# Patient Record
Sex: Male | Born: 1985 | Race: Black or African American | Hispanic: No | Marital: Single | Smoking: Current some day smoker
Health system: Southern US, Community
[De-identification: ages and names within clinical notes are randomized; demographics above are authoritative.]

## PROBLEM LIST (undated history)

## (undated) DIAGNOSIS — A630 Anogenital (venereal) warts: Secondary | ICD-10-CM

## (undated) DIAGNOSIS — F209 Schizophrenia, unspecified: Secondary | ICD-10-CM

---

## 2013-01-14 ENCOUNTER — Encounter (HOSPITAL_COMMUNITY): Payer: Self-pay | Admitting: Emergency Medicine

## 2013-01-14 ENCOUNTER — Emergency Department (HOSPITAL_COMMUNITY)
Admission: EM | Admit: 2013-01-14 | Discharge: 2013-01-14 | Disposition: A | Discharge status: Home / Self Care | Payer: Self-pay | Attending: Emergency Medicine | Admitting: Emergency Medicine

## 2013-01-14 DIAGNOSIS — Z8619 Personal history of other infectious and parasitic diseases: Secondary | ICD-10-CM | POA: Insufficient documentation

## 2013-01-14 DIAGNOSIS — Z8659 Personal history of other mental and behavioral disorders: Secondary | ICD-10-CM | POA: Insufficient documentation

## 2013-01-14 DIAGNOSIS — IMO0002 Reserved for concepts with insufficient information to code with codable children: Secondary | ICD-10-CM | POA: Insufficient documentation

## 2013-01-14 DIAGNOSIS — F17200 Nicotine dependence, unspecified, uncomplicated: Secondary | ICD-10-CM | POA: Insufficient documentation

## 2013-01-14 HISTORY — DX: Anogenital (venereal) warts: A63.0

## 2013-01-14 HISTORY — DX: Schizophrenia, unspecified: F20.9

## 2013-01-14 MED ORDER — HYDROCORTISONE 1 % EX CREA: TOPICAL_CREAM | CUTANEOUS | Status: DC

## 2013-01-14 NOTE — ED Provider Notes (Signed)
CSN: [id number]     Arrival date & time 01/14/13  1722 History  This chart was scribed for non-physician practitioner Coral Ceo, PA-C, working with Paul Canal, MD by Dorothey Baseman, ED Scribe. This patient was seen in room TR10C/TR10C and the patient's care was started at 8:08 PM.    Chief Complaint  Patient presents with  . Genital Warts   The history is provided by the patient. No language interpreter was used.   HPI Comments: Paul Mendez is a 27 y.o. Male with a history of genital warts and schizophrenia who presents to the Emergency Department complaining of genital sores.  Patient states that the genital sores have been present for 2 weeks. He states that the lesions have been spreading however are healing. Patient states that the sores are present on the shaft of the penis and super pubic region. He denies any sores to his scrotum or anus. He denies any drainage or spreading spreading redness/swelling.  Patient states that he has had genital warts past however the sores today are not similar. He's not had anything to alleviate his symptoms. He is unsure of his previous partners STI history. He denies associated penile discharge, penile pain, testicular pain, dysuria, or hematuria. He denies any new, recent sexual partners or potential exposure. Patient reports that he has not been sexually active since June, 2014. He is unsure of the STI status of his previous sexual partner. Patient states he is otherwise been well with no fever, chills, change in appetite or activity, abdominal pain, nausea, vomiting, diarrhea, constipation, myalgias, headache, dizziness, lightheadedness, rhinorrhea, congestion, eye pain or sore throat.    Past Medical History  Diagnosis Date  . Genital warts   . Schizophrenia    History reviewed. No pertinent past surgical history. No family history on file. History  Substance Use Topics  . Smoking status: Current Some Day Smoker    Types: Cigarettes  . Smokeless  tobacco: Not on file  . Alcohol Use: Yes     Comment: occ    Review of Systems  Constitutional: Negative for fever, chills, diaphoresis, activity change, appetite change and fatigue.  HENT: Negative for mouth sores, rhinorrhea, sore throat, trouble swallowing and voice change.   Eyes: Negative for pain, discharge, itching and visual disturbance.  Respiratory: Negative for shortness of breath.   Cardiovascular: Negative for chest pain.  Gastrointestinal: Negative for nausea, vomiting, abdominal pain, diarrhea, constipation and abdominal distention.  Genitourinary: Positive for genital sores. Negative for dysuria, urgency, hematuria, flank pain, decreased urine volume, discharge, penile swelling, scrotal swelling, difficulty urinating, penile pain and testicular pain.  Musculoskeletal: Negative for back pain, gait problem and myalgias.  Skin: Positive for wound.  Neurological: Negative for dizziness, weakness, light-headedness, numbness and headaches.    Allergies  Review of patient's allergies indicates no known allergies.  Home Medications  No current outpatient prescriptions on file.  Triage Vitals: BP 124/61  Pulse 73  Temp(Src) 98.3 F (36.8 C) (Oral)  Resp 16  Ht 6\' 2"  (1.88 m)  Wt 183 lb 3.2 oz (83.099 kg)  BMI 23.51 kg/m2  SpO2 97%  Filed Vitals:   01/14/13 1748 01/14/13 2311  BP: 124/61 124/88  Pulse: 73 56  Temp: 98.3 F (36.8 C)   TempSrc: Oral   Resp: 16 16  Height: 6\' 2"  (1.88 m)   Weight: 183 lb 3.2 oz (83.099 kg)   SpO2: 97% 99%     Physical Exam  Nursing note and vitals reviewed. Constitutional: He  is oriented to person, place, and time. He appears well-developed and well-nourished. No distress.  HENT:  Head: Normocephalic and atraumatic.  Right Ear: External ear normal.  Left Ear: External ear normal.  Mouth/Throat: Oropharynx is clear and moist. No oropharyngeal exudate.  Eyes: Conjunctivae are normal. Right eye exhibits no discharge. Left eye  exhibits no discharge.  Neck: Normal range of motion. Neck supple.  Cardiovascular: Normal rate, regular rhythm, normal heart sounds and intact distal pulses.  Exam reveals no gallop and no friction rub.   No murmur heard. Dorsalis pedis pulses present and equal bilaterally  Pulmonary/Chest: Effort normal and breath sounds normal. No respiratory distress. He has no wheezes. He has no rales. He exhibits no tenderness.  Abdominal: Soft. He exhibits no distension. There is no tenderness.  Genitourinary: No penile tenderness.  4-5 circular, 3 mm, papular, flesh-colored, closed, diffuse lesions present on the under side of the shaft of the penis without surrounding erythema or edema. 3-4 closed 5 mm scabs present in the suprapubic region with no open lesions or sores. No testicular tenderness or swelling. No penile tenderness or discharge. No erythema or sores to the urethral meatus. 2-3  1 cm, circular, mobile, firm inguinal lymph nodes palpated bilaterally with no overlying edema, erythema, or open lesions. No lesions to the external anus. No external fissures or hemorrhoids.  Musculoskeletal: Normal range of motion. He exhibits no edema and no tenderness.  Patient able to ambulate without difficulty or ataxia  Neurological: He is alert and oriented to person, place, and time.  Skin: Skin is warm and dry.  Psychiatric: He has a normal mood and affect. His behavior is normal.    ED Course  Procedures (including critical care time)  DIAGNOSTIC STUDIES: Oxygen Saturation is 97% on room air, normal by my interpretation.    COORDINATION OF CARE: 8:11PM- Discussed that sores do not appear like typical genital warts or herpes. Will order STD testing. Will consult with the attending physician. Discussed treatment plan with patient at bedside and patient verbalized agreement.   Labs Review Labs Reviewed - No data to display Imaging Review No results found.  EKG Interpretation   None       MDM    1. Genital problem     [name] is a 27 y.o. Male with a history of genital warts and schizophrenia who presents to the Emergency Department complaining of genital sores.  HIV, gonorrhea, Chlamydia, and syphilis testing ordered.     Etiology of genital sores is unclear.  Molluscum contagiosum is a possible etiology. Patient denies any recent sexual encounters or exposures. Patient had no evidence of vesicular lesions to suggest a herpes infection. His lesions also did not appear to be genital warts or a chancre. Patient's lesions appear relatively benign. Patient was given a prescription for hydrocortisone cream. Patient's STI testing is pending. He was instructed to refrain from intercourse until testing has returned. He was also instructed to practice safe sex. He was instructed to keep the area clean and dry and practice good hygiene. Patient was instructed to return to the ED if they experience any dysuria, hematuria, abdominal pain, repeated vomiting, testicular pain, fever, penile/testicular swelling or redness, or other concerns.  Patient instructed to follow up with a primary care provider as soon as possible for further evaluation and management. Patient was provided with the resource guide. Patient was in agreement with discharge and plan.     Final impressions: 1. genital problem    Greer Ee  Rubye Oaks PA-C   This patient was discussed with Dr. Silverio Lay who evaluated the patient  I personally performed the services described in this documentation, which was scribed in my presence. The recorded information has been reviewed and is accurate.     Jillyn Ledger, PA-C 01/15/13 0348  Jillyn Ledger, PA-C 01/15/13 [phone]

## 2013-01-14 NOTE — ED Notes (Signed)
Pt with hx of genital warts - states he having a "flare-up".

## 2013-01-15 LAB — GC/CHLAMYDIA PROBE AMP
CT Probe RNA: NEGATIVE
GC Probe RNA: NEGATIVE

## 2013-01-15 LAB — HIV ANTIBODY (ROUTINE TESTING W REFLEX): HIV: NONREACTIVE

## 2013-01-15 LAB — RPR: RPR Ser Ql: NONREACTIVE

## 2013-01-15 NOTE — ED Provider Notes (Signed)
Medical screening examination/treatment/procedure(s) were conducted as a shared visit with non-physician practitioner(s) and myself.  I personally evaluated the patient during the encounter  Paul Mendez is a 27 y.o. male here with lesions of penis. He hasn't been sexually active for several months. Noticed lesions on penis for the last 2 weeks. Lesions are not painful and he has no urethral discharge. Small macular lesions on penis but LAD. I doubt herpes. Can be allergic vs syphilis vs mild mulluscum. GC/chlamydia/syphilis ordered. Recommend hydrocortisone cream prn and safe sex.    Richardean Canal, MD 01/15/13 1332

## 2013-09-01 DIAGNOSIS — N529 Male erectile dysfunction, unspecified: Secondary | ICD-10-CM | POA: Insufficient documentation

## 2013-09-01 DIAGNOSIS — R590 Localized enlarged lymph nodes: Secondary | ICD-10-CM | POA: Insufficient documentation

## 2015-04-21 DIAGNOSIS — F17200 Nicotine dependence, unspecified, uncomplicated: Secondary | ICD-10-CM | POA: Insufficient documentation

## 2016-09-22 ENCOUNTER — Emergency Department (HOSPITAL_COMMUNITY): Payer: Self-pay

## 2016-09-22 ENCOUNTER — Encounter (HOSPITAL_COMMUNITY): Payer: Self-pay | Admitting: Emergency Medicine

## 2016-09-22 ENCOUNTER — Emergency Department (HOSPITAL_COMMUNITY)
Admission: EM | Admit: 2016-09-22 | Discharge: 2016-09-28 | Disposition: A | Discharge status: Other Acute Inpatient Hospital | Payer: Self-pay | Attending: Emergency Medicine | Admitting: Emergency Medicine

## 2016-09-22 DIAGNOSIS — S0083XA Contusion of other part of head, initial encounter: Secondary | ICD-10-CM | POA: Insufficient documentation

## 2016-09-22 DIAGNOSIS — S0993XA Unspecified injury of face, initial encounter: Secondary | ICD-10-CM

## 2016-09-22 DIAGNOSIS — F17210 Nicotine dependence, cigarettes, uncomplicated: Secondary | ICD-10-CM | POA: Insufficient documentation

## 2016-09-22 DIAGNOSIS — K0882 Secondary occlusal trauma: Secondary | ICD-10-CM | POA: Insufficient documentation

## 2016-09-22 DIAGNOSIS — Y999 Unspecified external cause status: Secondary | ICD-10-CM | POA: Insufficient documentation

## 2016-09-22 DIAGNOSIS — Z79899 Other long term (current) drug therapy: Secondary | ICD-10-CM | POA: Insufficient documentation

## 2016-09-22 DIAGNOSIS — F250 Schizoaffective disorder, bipolar type: Secondary | ICD-10-CM | POA: Diagnosis present

## 2016-09-22 DIAGNOSIS — F209 Schizophrenia, unspecified: Secondary | ICD-10-CM | POA: Insufficient documentation

## 2016-09-22 DIAGNOSIS — Y929 Unspecified place or not applicable: Secondary | ICD-10-CM | POA: Insufficient documentation

## 2016-09-22 DIAGNOSIS — Y939 Activity, unspecified: Secondary | ICD-10-CM | POA: Insufficient documentation

## 2016-09-22 DIAGNOSIS — R45851 Suicidal ideations: Secondary | ICD-10-CM | POA: Insufficient documentation

## 2016-09-22 LAB — CBC
HCT: 39 % (ref 39.0–52.0)
Hemoglobin: 13.8 g/dL (ref 13.0–17.0)
MCH: 32 pg (ref 26.0–34.0)
MCHC: 35.4 g/dL (ref 30.0–36.0)
MCV: 90.5 fL (ref 78.0–100.0)
Platelets: 357 10*3/uL (ref 150–400)
RBC: 4.31 MIL/uL (ref 4.22–5.81)
RDW: 12.6 % (ref 11.5–15.5)
WBC: 7 10*3/uL (ref 4.0–10.5)

## 2016-09-22 LAB — COMPREHENSIVE METABOLIC PANEL
ALBUMIN: 4.8 g/dL (ref 3.5–5.0)
ALT: 25 U/L (ref 17–63)
AST: 39 U/L (ref 15–41)
Alkaline Phosphatase: 64 U/L (ref 38–126)
Anion gap: 12 (ref 5–15)
BUN: 12 mg/dL (ref 6–20)
CO2: 25 mmol/L (ref 22–32)
Calcium: 9.4 mg/dL (ref 8.9–10.3)
Chloride: 102 mmol/L (ref 101–111)
Creatinine, Ser: 1.31 mg/dL — ABNORMAL HIGH (ref 0.61–1.24)
GFR calc non Af Amer: >60 mL/min (ref >60)
Glucose, Bld: 109 mg/dL — ABNORMAL HIGH (ref 65–99)
Potassium: 3.3 mmol/L — ABNORMAL LOW (ref 3.5–5.1)
Sodium: 139 mmol/L (ref 135–145)
Total Bilirubin: 0.5 mg/dL (ref 0.3–1.2)
Total Protein: 8 g/dL (ref 6.5–8.1)

## 2016-09-22 LAB — ACETAMINOPHEN LEVEL: Acetaminophen (Tylenol), Serum: <10 ug/mL — ABNORMAL LOW (ref 10–30)

## 2016-09-22 LAB — RAPID URINE DRUG SCREEN, HOSP PERFORMED
Amphetamines: NOT DETECTED
BENZODIAZEPINES: NOT DETECTED
Barbiturates: NOT DETECTED
COCAINE: NOT DETECTED
OPIATES: NOT DETECTED
TETRAHYDROCANNABINOL: NOT DETECTED

## 2016-09-22 LAB — ETHANOL: Alcohol, Ethyl (B): <5 mg/dL (ref <5)

## 2016-09-22 LAB — SALICYLATE LEVEL

## 2016-09-22 MED ORDER — OLANZAPINE 5 MG PO TABS
15.0000 mg | ORAL_TABLET | Freq: Every day | ORAL | Status: DC
  Administered 2016-09-23 – 2016-09-24 (×2): 15 mg via ORAL
  Filled 2016-09-22 (×2): qty 1
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
## 2016-09-22 NOTE — ED Notes (Signed)
Patient transported to CT 

## 2016-09-22 NOTE — ED Notes (Signed)
Velna HatchetMichelle Lewis :: [phone] - patients mother, call anytime for anything.

## 2016-09-22 NOTE — ED Notes (Signed)
Patient kneeling in floor to pray.
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
## 2016-09-23 MED ORDER — ACETAMINOPHEN 325 MG PO TABS
650.0000 mg | ORAL_TABLET | Freq: Four times a day (QID) | ORAL | Status: DC | PRN
  Administered 2016-09-23 – 2016-09-26 (×2): 650 mg via ORAL
  Filled 2016-09-23 (×2): qty 2

## 2016-09-23 MED ORDER — ENSURE ENLIVE PO LIQD
237.0000 mL | Freq: Three times a day (TID) | ORAL | Status: DC
  Administered 2016-09-23 – 2016-09-28 (×12): 237 mL via ORAL
  Filled 2016-09-23 (×15): qty 237

## 2016-09-23 MED ORDER — HYDROCODONE-ACETAMINOPHEN 7.5-325 MG/15ML PO SOLN
10.0000 mL | Freq: Once | ORAL | Status: AC
  Administered 2016-09-23: 10 mL via ORAL
  Filled 2016-09-23: qty 15

## 2016-09-23 MED ORDER — LORAZEPAM 1 MG PO TABS
2.0000 mg | ORAL_TABLET | Freq: Once | ORAL | Status: AC
  Administered 2016-09-23: 2 mg via ORAL
  Filled 2016-09-23: qty 2

## 2016-09-23 MED ORDER — HYDROCODONE-ACETAMINOPHEN 5-325 MG PO TABS
1.0000 | ORAL_TABLET | Freq: Four times a day (QID) | ORAL | Status: DC | PRN
  Administered 2016-09-23: 1 via ORAL
  Filled 2016-09-23: qty 1

## 2016-09-23 NOTE — ED Notes (Signed)
Pt up and walking hall at times and sleeps for short time.  Pt sts his jaw and lip hurt and only asks for ice to apply.  Pt is offered PRN pain med and something for anxiety. Pt contracts for safety after thinking about it for awhile.  Pt room is in view from nurses station. Pt takes pain med and anxiety med.  Pt lying down and trying to sleep. Pt remains safe on unit.

## 2016-09-23 NOTE — ED Notes (Signed)
Introduced self to patient. Pt oriented to unit expectations.  Assessed pt for:  A) Anxiety &/or agitation: On admission to the [facility] pt is cooperative, but seems restless as he paces the halls. He has been redirected to stay on his side of the unit to avoid a potential clash with another patient. Pt said that he preferred the room he was in previously. Denies SI/HI/AVH. He said that he is ready to go home. He has an ice pack on his face and has been given a pop-cycle. He did not want pain medication that was offered to him.   S) Safety: Safety maintained with q-15-minute checks and hourly rounds by staff.  A) ADLs: Pt able to perform ADLs independently.  P) Pick-Up (room cleanliness): Pt's room clean and free of clutter.
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
## 2016-09-23 NOTE — ED Notes (Signed)
Pt frequently yells out and appears to be responding to internal stimulation. He continues to refused pain medication. He ate [place] slices off of a sandwich without difficulty. Mechanical soft diet tray with thin liquid ordered for him.
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
## 2016-09-23 NOTE — ED Notes (Signed)
Bed: [facility]43 Expected date:  Expected time:  Means of arrival:  Comments: Hold for room 30 
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
## 2016-09-24 DIAGNOSIS — F1099 Alcohol use, unspecified with unspecified alcohol-induced disorder: Secondary | ICD-10-CM

## 2016-09-24 DIAGNOSIS — R45851 Suicidal ideations: Secondary | ICD-10-CM

## 2016-09-24 DIAGNOSIS — R443 Hallucinations, unspecified: Secondary | ICD-10-CM

## 2016-09-24 DIAGNOSIS — F17210 Nicotine dependence, cigarettes, uncomplicated: Secondary | ICD-10-CM

## 2016-09-24 DIAGNOSIS — F250 Schizoaffective disorder, bipolar type: Secondary | ICD-10-CM

## 2016-09-24 MED ORDER — ZIPRASIDONE MESYLATE 20 MG IM SOLR
20.0000 mg | INTRAMUSCULAR | Status: AC
  Administered 2016-09-24: 20 mg via INTRAMUSCULAR
  Filled 2016-09-24: qty 20

## 2016-09-24 MED ORDER — ASENAPINE MALEATE 5 MG SL SUBL
10.0000 mg | SUBLINGUAL_TABLET | Freq: Two times a day (BID) | SUBLINGUAL | Status: DC
  Administered 2016-09-24 – 2016-09-28 (×8): 10 mg via SUBLINGUAL
  Filled 2016-09-24 (×8): qty 2

## 2016-09-24 MED ORDER — MAGIC MOUTHWASH
5.0000 mL | Freq: Four times a day (QID) | ORAL | Status: DC | PRN
  Administered 2016-09-24: 5 mL via ORAL
  Filled 2016-09-24: qty 5

## 2016-09-24 MED ORDER — LORAZEPAM 2 MG/ML IJ SOLN
2.0000 mg | INTRAMUSCULAR | Status: AC
  Administered 2016-09-24: 2 mg via INTRAMUSCULAR
  Filled 2016-09-24: qty 1

## 2016-09-24 MED ORDER — DIPHENHYDRAMINE HCL 50 MG/ML IJ SOLN
50.0000 mg | INTRAMUSCULAR | Status: AC
  Administered 2016-09-24: 50 mg via INTRAMUSCULAR
  Filled 2016-09-24: qty 1

## 2016-09-24 NOTE — ED Notes (Signed)
Pt woke up multiple times from sleep screaming. Pt later paced the hall and went back to his room. Pt stated he slept a lot during the day and does not need any medication for sleep. Pt encouraged to stay in room and rest. Pt in room awake

## 2016-09-24 NOTE — Progress Notes (Signed)
09/24/16 1425:  Pt came in late, sat down and went to sleep.   Paul RancherMarjette Vernetta Dizdarevic, LRT/CTRS
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
## 2016-09-24 NOTE — ED Notes (Signed)
Pt has been calm, cooperative and appropriate today. 
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
## 2016-09-24 NOTE — ED Notes (Signed)
Introduced self to patient. Pt oriented to unit expectations.  Assessed pt for:  A) Anxiety &/or agitation: Pt has been cooperative today. He makes no verbal or physical threats, but yells out occasionally. When on the telephone he yelled at the person that he was talking to and used bad language. He is lucid, but does not understand the reason that we are keeping him here now that his mouth is not being treated medically. He uses ice for pain management with no complaints of pain and refuses offers of pain medication. He takes his other medications. He said that he has been on zyprexa in the past. This Clinical research associatewriter has tried several times to call [facility] to confirm pt's medications, but have been unable to get in touch with them.   S) Safety: Safety maintained with q-15-minute checks and hourly rounds by staff.  A) ADLs: Pt able to perform ADLs independently.  P) Pick-Up (room cleanliness): Pt's room clean and free of clutter.

## 2016-09-25 ENCOUNTER — Other Ambulatory Visit: Payer: Self-pay

## 2016-09-25 MED ORDER — LORAZEPAM 2 MG/ML IJ SOLN
2.0000 mg | Freq: Once | INTRAMUSCULAR | Status: AC
  Administered 2016-09-25: 2 mg via INTRAMUSCULAR
  Filled 2016-09-25: qty 1

## 2016-09-25 MED ORDER — STERILE WATER FOR INJECTION IJ SOLN
INTRAMUSCULAR | Status: AC
  Administered 2016-09-25: 12 mL
  Filled 2016-09-25: qty 10

## 2016-09-25 MED ORDER — ZIPRASIDONE MESYLATE 20 MG IM SOLR
20.0000 mg | Freq: Once | INTRAMUSCULAR | Status: AC
  Administered 2016-09-25: 20 mg via INTRAMUSCULAR
  Filled 2016-09-25: qty 20

## 2016-09-25 MED ORDER — DIPHENHYDRAMINE HCL 50 MG/ML IJ SOLN
50.0000 mg | Freq: Once | INTRAMUSCULAR | Status: AC
  Administered 2016-09-25: 50 mg via INTRAMUSCULAR
  Filled 2016-09-25: qty 1

## 2016-09-25 NOTE — Progress Notes (Signed)
09/25/16 1350:  LRT was informed by staff to let pt remain sleep.  Breeana Sawtelle Linday, LRT/CTRS
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
## 2016-09-25 NOTE — Progress Notes (Signed)
Pt asking when he can go home.  Pt sts everyone says they cannot make that decision and wants to know who to talk to.  Pt unable to elaborate on why he thinks he is here.  Pt does not answer when asked if he can contract for safety.  Pt out in hall doing push-ups, shadow boxing and peeking thru double doors into next Dept.  Pt asks for snacks, is med compliant and returns to room to sleep for awhile after using phone.  Pt given snacks and drink.  Pt currently in bed with occasional loud outburst then goes back to sleep.

## 2016-09-25 NOTE — ED Notes (Signed)
Pt is alert in his room with mild jaw discomfort. Pt was given an ice pack and breakfast has been ordered for a soft diet. Pt denies si and hi.

## 2016-09-25 NOTE — ED Notes (Signed)
Pt's behavior escalated and pt started yelling while on the phone and then started running into the door. MD called and orders placed. Safety maintained.
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
## 2016-09-25 NOTE — ED Notes (Signed)
Pt's mother came to the hospital and pt was asleep. She left paper work for the MD and a copy for nurse. Paper work given to triage specialist as MD is not in the office. A copy placed in pt's chart. Pt's mother is Velna HatchetMichelle Lewis [phone].

## 2016-09-25 NOTE — ED Notes (Signed)
EKG completed

## 2016-09-26 MED ORDER — NICOTINE 21 MG/24HR TD PT24
21.0000 mg | MEDICATED_PATCH | Freq: Once | TRANSDERMAL | Status: AC
  Administered 2016-09-26: 21 mg via TRANSDERMAL
  Filled 2016-09-26: qty 1

## 2016-09-26 MED ORDER — DIVALPROEX SODIUM 500 MG PO DR TAB
500.0000 mg | DELAYED_RELEASE_TABLET | Freq: Two times a day (BID) | ORAL | Status: DC
  Administered 2016-09-26 – 2016-09-28 (×5): 500 mg via ORAL
  Filled 2016-09-26 (×5): qty 1

## 2016-09-26 NOTE — Progress Notes (Signed)
09/26/16 1337: Pt was lying down and appeared drowsy.  Pt declined activities.   Paul RancherMarjette Nakiesha Rumsey, LRT/CTRS
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
## 2016-09-26 NOTE — ED Notes (Signed)
Pt mother at bedside visiting with pt.  

## 2016-09-26 NOTE — ED Notes (Signed)
Pt and pt mother requesting to speak to Child psychotherapistsocial worker. This nurse left message for social worker.

## 2016-09-26 NOTE — ED Notes (Signed)
Pt taking a shower 

## 2016-09-26 NOTE — ED Notes (Signed)
Pt observed shadow boxing in hallway, restless. Pt denies SI/HI, reports paranoia. Compliant with morning medication regimen. Encouragement and support provided. Special checks q 15 mins in place for safety, Video monitoring in place. Will continue to monitor.

## 2016-09-26 NOTE — BH Assessment (Signed)
B[facility] Assessment Progress Note  Per Thedore MinsMojeed Akintayo, MD, this pt continues to require psychiatric hospitalization at this time.  The following facilities have been contacted to seek placement for this pt, with results as noted:  Beds available, information sent, decision pending:  [facility]   [facility]:  [facility]   Anyelina [place], [place] Triage Specialist [phone]

## 2016-09-27 NOTE — BH Assessment (Addendum)
B[facility] Assessment Progress Note   Per Thedore MinsMojeed Akintayo, MD, this pt continues to require psychiatric hospitalization at this time.  The following facilities have been contacted to seek placement for this pt, with results as noted:  Beds available, information sent, decision pending:  [facility]   Declined:  [facility] (due to pt acuity)   At capacity:  [facility]   Paul Mendez, [place] Triage Specialist [phone]

## 2016-09-27 NOTE — ED Notes (Signed)
Pt observed playing cards with peers and RT.

## 2016-09-27 NOTE — ED Notes (Signed)
Pt compliant with prescribed medication regimen. Denies SI/HI, forwards little with this nurse. Encouragement and support provided. Special checks q 15 mins in place for safety, Video monitoring in place. Will continue to monitor.

## 2016-09-27 NOTE — Progress Notes (Signed)
[facility]. Dr. Durene CalMuthukanagaraj.  Call report to [phone].  Pt may be transferred at any time.   [facility] ED Nurse, Morrie SheldonAshley, notified.  Timmothy EulerJean T. Kaylyn LimSutter, MSW, LCSWA Clinical Social Work Disposition [phone]

## 2016-09-27 NOTE — ED Notes (Signed)
SBAR Report received from previous nurse. Pt received calm and visible on unit. Pt denies current SI/ HI, A/V H, depression, anxiety, or pain at this time, and appears otherwise stable and free of distress. Pt reminded of camera surveillance, q 15 min rounds, and rules of the milieu. Will continue to assess. 

## 2016-09-27 NOTE — Progress Notes (Signed)
09/27/16 1257:  LRT introduced self to pt and offered activities.  Pt met LRT in dayroom and participated in activities with peers.  Pt was social, laughing and joking with peers.  Pt was engaged and bright during activities.  Victorino Sparrow, LRT/CTRS
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
## 2016-09-27 NOTE — BH Assessment (Signed)
B[facility] Assessment Progress Note This Clinical research associatewriter spoke with patient this date to re-evaluate and assess treatment progress. Patient continues to be disorganized but can be redirected if question is reframed. Patient is oriented to place and day but is slow to respond to this writer's questions. Patient appears to be responding to internal stimuli and seems to be confused when interacting with this Clinical research associatewriter. Patient answers, "I don't know" to most questions. When this writer attempts to assess progress patient stated he "might need a little more time here" because he "isn't right in the head." Patient is very vague in reference to symptoms but denies any thoughts of self harm. Patient does admit to ongoing AH stating "I still hear them you know." Patient would not elaborate on content but denies H/I or VH. Case was staffed with Jannifer FranklinAkintayo MD, Cresenciano GenreLord DNP who recommended ongoing inpatient monitoring as appropriate bed placement is investigated.
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
## 2016-09-28 NOTE — ED Notes (Signed)
Pt taking a shower 

## 2016-10-19 ENCOUNTER — Encounter (HOSPITAL_COMMUNITY): Payer: Self-pay | Admitting: Emergency Medicine

## 2016-10-19 ENCOUNTER — Emergency Department (HOSPITAL_COMMUNITY)
Admission: EM | Admit: 2016-10-19 | Discharge: 2016-10-21 | Disposition: A | Discharge status: Home / Self Care | Payer: Self-pay | Attending: Emergency Medicine | Admitting: Emergency Medicine

## 2016-10-19 DIAGNOSIS — Z79899 Other long term (current) drug therapy: Secondary | ICD-10-CM | POA: Insufficient documentation

## 2016-10-19 DIAGNOSIS — F17210 Nicotine dependence, cigarettes, uncomplicated: Secondary | ICD-10-CM | POA: Insufficient documentation

## 2016-10-19 DIAGNOSIS — F250 Schizoaffective disorder, bipolar type: Secondary | ICD-10-CM | POA: Diagnosis present

## 2016-10-19 DIAGNOSIS — R443 Hallucinations, unspecified: Secondary | ICD-10-CM

## 2016-10-19 DIAGNOSIS — F200 Paranoid schizophrenia: Secondary | ICD-10-CM | POA: Diagnosis present

## 2016-10-19 LAB — COMPREHENSIVE METABOLIC PANEL
ALBUMIN: 4.6 g/dL (ref 3.5–5.0)
ALT: 15 U/L — ABNORMAL LOW (ref 17–63)
AST: 21 U/L (ref 15–41)
Alkaline Phosphatase: 71 U/L (ref 38–126)
Anion gap: 10 (ref 5–15)
BUN: 14 mg/dL (ref 6–20)
CHLORIDE: 105 mmol/L (ref 101–111)
CO2: 25 mmol/L (ref 22–32)
Calcium: 9.6 mg/dL (ref 8.9–10.3)
Creatinine, Ser: 0.99 mg/dL (ref 0.61–1.24)
GFR calc Af Amer: >60 mL/min (ref >60)
GFR calc non Af Amer: >60 mL/min (ref >60)
GLUCOSE: 108 mg/dL — AB (ref 65–99)
POTASSIUM: 4.1 mmol/L (ref 3.5–5.1)
SODIUM: 140 mmol/L (ref 135–145)
Total Bilirubin: 0.3 mg/dL (ref 0.3–1.2)
Total Protein: 7.9 g/dL (ref 6.5–8.1)

## 2016-10-19 LAB — CBC WITH DIFFERENTIAL/PLATELET
BASOS ABS: 0 10*3/uL (ref 0.0–0.1)
BASOS PCT: 0 %
EOS PCT: 2 %
Eosinophils Absolute: 0.1 10*3/uL (ref 0.0–0.7)
HCT: 41 % (ref 39.0–52.0)
Hemoglobin: 14.6 g/dL (ref 13.0–17.0)
Lymphocytes Relative: 19 %
Lymphs Abs: 1.6 10*3/uL (ref 0.7–4.0)
MCH: 32.4 pg (ref 26.0–34.0)
MCHC: 35.6 g/dL (ref 30.0–36.0)
MCV: 90.9 fL (ref 78.0–100.0)
MONO ABS: 1 10*3/uL (ref 0.1–1.0)
MONOS PCT: 12 %
Neutro Abs: 5.5 10*3/uL (ref 1.7–7.7)
Neutrophils Relative %: 67 %
PLATELETS: 304 10*3/uL (ref 150–400)
RBC: 4.51 MIL/uL (ref 4.22–5.81)
RDW: 12.2 % (ref 11.5–15.5)
WBC: 8.1 10*3/uL (ref 4.0–10.5)

## 2016-10-19 LAB — ETHANOL: Alcohol, Ethyl (B): <5 mg/dL (ref <5)

## 2016-10-19 LAB — RAPID URINE DRUG SCREEN, HOSP PERFORMED
Amphetamines: NOT DETECTED
BARBITURATES: NOT DETECTED
BENZODIAZEPINES: NOT DETECTED
COCAINE: NOT DETECTED
Opiates: NOT DETECTED
TETRAHYDROCANNABINOL: NOT DETECTED

## 2016-10-19 LAB — SALICYLATE LEVEL: Salicylate Lvl: <7 mg/dL (ref 2.8–30.0)

## 2016-10-19 LAB — ACETAMINOPHEN LEVEL: Acetaminophen (Tylenol), Serum: <10 ug/mL — ABNORMAL LOW (ref 10–30)

## 2016-10-19 MED ORDER — TRAZODONE HCL 50 MG PO TABS
50.0000 mg | ORAL_TABLET | Freq: Every day | ORAL | Status: DC
  Administered 2016-10-19: 50 mg via ORAL
  Filled 2016-10-19: qty 1

## 2016-10-19 MED ORDER — RISPERIDONE 1 MG PO TABS
1.0000 mg | ORAL_TABLET | Freq: Two times a day (BID) | ORAL | Status: DC
  Administered 2016-10-19 – 2016-10-21 (×4): 1 mg via ORAL
  Filled 2016-10-19 (×4): qty 1

## 2016-10-19 MED ORDER — NICOTINE 21 MG/24HR TD PT24: 21.0000 mg | MEDICATED_PATCH | Freq: Every day | TRANSDERMAL | Status: DC

## 2016-10-19 MED ORDER — IBUPROFEN 200 MG PO TABS: 600.0000 mg | ORAL_TABLET | Freq: Three times a day (TID) | ORAL | Status: DC | PRN

## 2016-10-19 MED ORDER — DIVALPROEX SODIUM 500 MG PO DR TAB
500.0000 mg | DELAYED_RELEASE_TABLET | Freq: Two times a day (BID) | ORAL | Status: DC
  Administered 2016-10-19 – 2016-10-21 (×4): 500 mg via ORAL
  Filled 2016-10-19 (×4): qty 1

## 2016-10-19 NOTE — ED Notes (Signed)
Bed: [id number] Expected date:  Expected time:  Means of arrival:  Comments: Margo AyeHall c
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
## 2016-10-19 NOTE — ED Notes (Signed)
Pt ambulatory w/o difficulty to room 39 
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
## 2016-10-19 NOTE — ED Triage Notes (Signed)
[facility]. Per [employer], [facility] was [facility] and [facility], [facility] not [facility] back. Pt has not been violent toward anyone. Pt c/o fear that his family will be harmed, but unable to identify who or what will harm his family. Endorses hallucinations. Denies SI/HI.

## 2016-10-19 NOTE — BH Assessment (Addendum)
Assessment Note  Paul DownerBrian Mendez is an 31 y.o. male that presents this date after [facility] but then left and was not allowed to return due to psychosis/bizarre behaviors. [facility]. Patient displays active thought blocking and is unable to participate in the assessment. Per report, patient is attempting to leave [facility] and per staff is observed to be very disorganized. This Clinical research associatewriter attempts to access/gather information unsuccessfully. Patient just nods "yes" when asked if he [facility]. Patient is not oriented to time/place. Patient denies (nods no) when asked if he is S/I, H/I but admits to active AVH. Patient will not elaborate on content of AVH. Patient does not seem to be responding to internal stimuli. This writer attempts several times to re-frame questions to gather information from patient unsuccessfully. Patient will not respond to questions. Information for purposes of assessment was gathered from prior notes. Patient has a prior admission on 09/27/16 when he presented for similar symptoms but did express S/I at that time. Per notes [facility] although this Clinical research associatewriter is unsure when he last saw that provider before the incident this date. Case was staffed with Shaune PollackLord DNP who recommended a inpatient admission as approrpite bed placement is investigated.  Diagnosis: Schizophrenia (per notes)  Past Medical History:  Past Medical History:  Diagnosis Date  . Genital warts   . Schizophrenia (HCC)     History reviewed. No pertinent surgical history.  Family History: History reviewed. No pertinent family history.  Social History:  reports that he has been smoking Cigarettes.  He has never used smokeless tobacco. He reports that he drinks alcohol. He reports that he does not use drugs.  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  General Assessment Data [facility] of [facility]: [facility] Assessment: In system Is this a Tele or Face-to-Face Assessment?: Face-to-Face Is this an Initial Assessment or a Re-assessment for this encounter?: Initial Assessment Marital status: Single Maiden name: NA Is patient pregnant?: No Pregnancy Status: No Living Arrangements: Other relatives (Pt resides with sister) Can pt return to current living arrangement?: Yes Admission Status: Voluntary Is patient capable of signing voluntary admission?: Yes Referral Source: Self/Family/Friend Insurance type: Self Pay  Medical Screening Exam [facility] Walk-in ONLY) Medical Exam completed: Yes  Crisis Care Plan Living Arrangements: Other relatives (Pt resides with sister) Legal Guardian:  (NA) [facility]: [facility]  Education Status Is patient currently in school?: No Current Grade:  (NA) Highest grade of school patient has completed:  (NA) Name of school:  (12th) Contact person:  (NA)  Risk  to self with the past 6 months Suicidal Ideation: No Has patient been a risk to self within the past 6 months prior to admission? : Yes Suicidal Intent:  No Has patient had any suicidal intent within the past 6 months prior to admission? : Yes Is patient at risk for suicide?: Yes Suicidal Plan?: No Has patient had any suicidal plan within the past 6 months prior to admission? : Yes Specify Current Suicidal Plan: Denies Access to Means: No What has been your use of drugs/alcohol within the last 12 months?: Denies current use Previous Attempts/Gestures: Yes How many times?: 1 Other Self Harm Risks: NA Triggers for Past Attempts: Unknown Intentional Self Injurious Behavior: None Family Suicide History: No Recent stressful life event(s): Other (Comment) (Unknown) Persecutory voices/beliefs?: No Depression: No Depression Symptoms:  (Denies symptoms) Substance abuse history and/or treatment for substance abuse?: No Suicide prevention information given to non-admitted patients: Not applicable  Risk to Others within the past 6 months Homicidal Ideation: No Does patient have any lifetime risk of violence toward others beyond the six months prior to admission? : No Thoughts of Harm to Others: No Current Homicidal Intent: No Current Homicidal Plan: No Access to Homicidal Means: No Identified Victim: NA History of harm to others?: No Assessment of Violence: None Noted Violent Behavior Description: NA Does patient have access to weapons?: No Criminal Charges Pending?: No Does patient have a court date: No Is patient on probation?: No  Psychosis Hallucinations: None noted Delusions: None noted  Mental Status Report Appearance/Hygiene: In scrubs Eye Contact: Fair Motor Activity: Freedom of movement Speech: Soft, Slow Level of Consciousness: Quiet/awake Mood: Apprehensive Affect: Blunted Anxiety Level: Moderate Thought Processes: Thought Blocking Judgement: Partial Orientation: Place Obsessive Compulsive Thoughts/Behaviors: None  Cognitive Functioning Concentration: Decreased Memory: Unable to Assess IQ: Average Insight:  Unable to Assess Impulse Control: Unable to Assess Appetite:  (UTA) Weight Loss:  (UTA) Weight Gain:  (UTA) Sleep:  (UTA) Total Hours of Sleep:  (UTA) Vegetative Symptoms: None  ADLScreening [facility] Services) Patient's cognitive ability adequate to safely complete daily activities?: Yes Patient able to express need for assistance with ADLs?: Yes Independently performs ADLs?: Yes (appropriate for developmental age)  Prior Inpatient Therapy Prior Inpatient Therapy: Yes Prior Therapy Dates: 2018 Prior Therapy Facilty/Provider(s[facility] Reason for Treatment: MH issues, S/I  Prior Outpatient Therapy Prior Outpatient Therapy: Yes Prior Therapy Dates: Ongoing Prior Therapy Facilty/Provider(s): [facility] Reason for Treatment: Med mang Does patient have an ACCT team?: No Does patient have Intensive In-House Services?  : No Does patient have [facility] services? : Yes Does patient have P4CC services?: No  ADL Screening (condition at time of admission) Patient's cognitive ability adequate to safely complete daily activities?: Yes Is the patient deaf or have difficulty hearing?: No Does the patient have difficulty seeing, even when wearing glasses/contacts?: No Does the patient have difficulty concentrating, remembering, or making decisions?: No Patient able to express need for assistance with ADLs?: Yes Does the patient have difficulty dressing or bathing?: No Independently performs ADLs?: Yes (appropriate for developmental age) Does the patient have difficulty walking or climbing stairs?: No Weakness of Legs: None Weakness of Arms/Hands: None  Home Assistive Devices/Equipment Home Assistive Devices/Equipment: None  Therapy Consults (therapy consults require a physician order) PT Evaluation Needed: No OT Evalulation Needed: No SLP Evaluation Needed: No Abuse/Neglect Assessment (Assessment to be complete while patient is alone) Physical Abuse: Denies Verbal Abuse:  Denies Sexual Abuse: Denies Exploitation of patient/patient's resources: Denies Self-Neglect: Denies Values / Beliefs  Cultural Requests During Hospitalization: None Spiritual Requests During Hospitalization: None Consults Spiritual Care Consult Needed: No Social Work Consult Needed: No Merchant navy officer (For Healthcare) Does Patient Have a Medical Advance Directive?: No Would patient like information on creating a medical advance directive?: No - Patient declined    Additional Information 1:1 In Past 12 Months?: No CIRT Risk: No Elopement Risk: Yes Does patient have medical clearance?: Yes     Disposition: Case was staffed with Shaune Pollack DNP who recommended a inpatient admission as approrpite bed placement is investigated.  Disposition Initial Assessment Completed for this Encounter: Yes Disposition of Patient: Other dispositions Type of inpatient treatment program: Adult Other disposition(s): Other (Comment) (Re-evaluate in the a.m.)  On Site Evaluation by:   Reviewed with Physician:    Alfredia Ferguson 10/19/2016 4:22 PM

## 2016-10-19 NOTE — ED Notes (Signed)
Pt denied having to urinate. 

## 2016-10-19 NOTE — ED Notes (Signed)
[facility] Press photographer(Crystal RN) contacted and they will fax their list of medications given.  Pt's meds per Crystal:                   risperiodol 1 mg twice a day (recieved extra dose last night)                    depakote ER- 1000mg  twice a day-started this AM                  Traxadone 100mg  at bedtime-received 2- 50mg  doses last                                         Night. She also reports that he has 0.1 mg of clonidine this AM for anxiety.

## 2016-10-19 NOTE — BH Assessment (Addendum)
[facility] Assessment Progress Note   Case was staffed with Lord DNP who recommended a inpatient admission.   

## 2016-10-20 DIAGNOSIS — F17210 Nicotine dependence, cigarettes, uncomplicated: Secondary | ICD-10-CM

## 2016-10-20 DIAGNOSIS — F29 Unspecified psychosis not due to a substance or known physiological condition: Secondary | ICD-10-CM

## 2016-10-20 DIAGNOSIS — F200 Paranoid schizophrenia: Secondary | ICD-10-CM | POA: Diagnosis present

## 2016-10-20 MED ORDER — TRAZODONE HCL 100 MG PO TABS
100.0000 mg | ORAL_TABLET | Freq: Every day | ORAL | Status: DC
  Administered 2016-10-20: 100 mg via ORAL
  Filled 2016-10-20: qty 1

## 2016-10-20 NOTE — Consult Note (Signed)
[facility] Psychiatry Consult   Reason for Consult:  Psychosis  Referring Physician:  EDP Patient Identification: Paul Mendez MRN:  [id number] Principal Diagnosis: Schizoaffective disorder, bipolar type [facility]) Diagnosis:   Patient Active Problem List   Diagnosis Date Noted  . Schizoaffective disorder, bipolar type ([place]) [F25.0] 09/24/2016    Priority: High    Total Time spent with patient: 45 minutes  Subjective:   Paul Mendez is a 31 y.o. male patient admitted with psychosis.  HPI:  31 yo male presented to the ED under IVC for psychosis from [place] where he went for medications.  Today, he continues to hear voices and paranoid about people being after her.  "I feel like my family got killed last night."  Calm and cooperative.  No suicidal/homicidal ideations or alcohol drug abuse.  Needs inpatient hospitalization for stabilization.  Past Psychiatric History: schizophrenia  Risk to Self: Suicidal Ideation: No Suicidal Intent: No Is patient at risk for suicide?: Yes Suicidal Plan?: No Specify Current Suicidal Plan: Denies Access to Means: No What has been your use of drugs/alcohol within the last 12 months?: Denies current use How many times?: 1 Other Self Harm Risks: NA Triggers for Past Attempts: Unknown Intentional Self Injurious Behavior: None Risk to Others: Homicidal Ideation: No Thoughts of Harm to Others: No Current Homicidal Intent: No Current Homicidal Plan: No Access to Homicidal Means: No Identified Victim: NA History of harm to others?: No Assessment of Violence: None Noted Violent Behavior Description: NA Does patient have access to weapons?: No Criminal Charges Pending?: No Does patient have a court date: No Prior Inpatient Therapy: Prior Inpatient Therapy: Yes Prior Therapy Dates: 2018 Prior Therapy Facilty/Provider(s): [facility] Reason for Treatment: MH issues, S/I Prior Outpatient Therapy: Prior Outpatient Therapy: Yes Prior Therapy Dates:  Ongoing Prior Therapy Facilty/Provider(s): [facility] Reason for Treatment: Med mang Does patient have an ACCT team?: No Does patient have Intensive In-House Services?  : No Does patient have [facility] services? : Yes Does patient have P4CC services?: No  Past Medical History:  Past Medical History:  Diagnosis Date  . Genital warts   . Schizophrenia ([place])    History reviewed. No pertinent surgical history. Family History: History reviewed. No pertinent family history. Family Psychiatric  History: unknown Social History:  History  Alcohol Use  . Yes    Comment: occ     History  Drug Use No    Social History   Social History  . Marital status: Single    Spouse name: N/A  . Number of children: N/A  . Years of education: N/A   Social History Main Topics  . Smoking status: Current Some Day Smoker    Types: Cigarettes  . Smokeless tobacco: Never Used  . Alcohol use Yes     Comment: occ  . Drug use: No  . Sexual activity: Not Asked   Other Topics Concern  . None   Social History Narrative  . None   Additional Social History:    Allergies:  No Known Allergies  Labs:  Results for orders placed or performed during the hospital encounter of 10/19/16 (from the past 48 hour(s))  Comprehensive metabolic panel     Status: Abnormal   Collection Time: 10/19/16  2:56 PM  Result Value Ref Range   Sodium 140 135 - 145 mmol/L   Potassium 4.1 3.5 - 5.1 mmol/L   Chloride 105 101 - 111 mmol/L   CO2 25 22 - 32 mmol/L   Glucose, Bld 108 (H) 65 -  99 mg/dL   BUN 14 6 - 20 mg/dL   Creatinine, Ser 0.99 0.61 - 1.24 mg/dL   Calcium 9.6 8.9 - 10.3 mg/dL   Total Protein 7.9 6.5 - 8.1 g/dL   Albumin 4.6 3.5 - 5.0 g/dL   AST 21 15 - 41 U/L   ALT 15 (L) 17 - 63 U/L   Alkaline Phosphatase 71 38 - 126 U/L   Total Bilirubin 0.3 0.3 - 1.2 mg/dL   GFR calc non Af Amer >60 >60 mL/min   GFR calc Af Amer >60 >60 mL/min    Comment: (NOTE) The eGFR has been calculated using the CKD EPI  equation. This calculation has not been validated in all clinical situations. eGFR's persistently <60 mL/min signify possible Chronic Kidney Disease.    Anion gap 10 5 - 15  CBC with Diff     Status: None   Collection Time: 10/19/16  2:56 PM  Result Value Ref Range   WBC 8.1 4.0 - 10.5 K/uL   RBC 4.51 4.22 - 5.81 MIL/uL   Hemoglobin 14.6 13.0 - 17.0 g/dL   HCT 41.0 39.0 - 52.0 %   MCV 90.9 78.0 - 100.0 fL   MCH 32.4 26.0 - 34.0 pg   MCHC 35.6 30.0 - 36.0 g/dL   RDW 12.2 11.5 - 15.5 %   Platelets 304 150 - 400 K/uL   Neutrophils Relative % 67 %   Neutro Abs 5.5 1.7 - 7.7 K/uL   Lymphocytes Relative 19 %   Lymphs Abs 1.6 0.7 - 4.0 K/uL   Monocytes Relative 12 %   Monocytes Absolute 1.0 0.1 - 1.0 K/uL   Eosinophils Relative 2 %   Eosinophils Absolute 0.1 0.0 - 0.7 K/uL   Basophils Relative 0 %   Basophils Absolute 0.0 0.0 - 0.1 K/uL  Ethanol     Status: None   Collection Time: 10/19/16  4:40 PM  Result Value Ref Range   Alcohol, Ethyl (B) <5 <5 mg/dL    Comment:        LOWEST DETECTABLE LIMIT FOR SERUM ALCOHOL IS 5 mg/dL FOR MEDICAL PURPOSES ONLY   Salicylate level     Status: None   Collection Time: 10/19/16  4:40 PM  Result Value Ref Range   Salicylate Lvl <1.6 2.8 - 30.0 mg/dL  Acetaminophen level     Status: Abnormal   Collection Time: 10/19/16  4:40 PM  Result Value Ref Range   Acetaminophen (Tylenol), Serum <10 (L) 10 - 30 ug/mL    Comment:        THERAPEUTIC CONCENTRATIONS VARY SIGNIFICANTLY. A RANGE OF 10-30 ug/mL MAY BE AN EFFECTIVE CONCENTRATION FOR MANY PATIENTS. HOWEVER, SOME ARE BEST TREATED AT CONCENTRATIONS OUTSIDE THIS RANGE. ACETAMINOPHEN CONCENTRATIONS >150 ug/mL AT 4 HOURS AFTER INGESTION AND >50 ug/mL AT 12 HOURS AFTER INGESTION ARE OFTEN ASSOCIATED WITH TOXIC REACTIONS.   Urine rapid drug screen (hosp performed)     Status: None   Collection Time: 10/19/16  7:35 PM  Result Value Ref Range   Opiates NONE DETECTED NONE DETECTED   Cocaine  NONE DETECTED NONE DETECTED   Benzodiazepines NONE DETECTED NONE DETECTED   Amphetamines NONE DETECTED NONE DETECTED   Tetrahydrocannabinol NONE DETECTED NONE DETECTED   Barbiturates NONE DETECTED NONE DETECTED    Comment:        DRUG SCREEN FOR MEDICAL PURPOSES ONLY.  IF CONFIRMATION IS NEEDED FOR ANY PURPOSE, NOTIFY LAB WITHIN 5 DAYS.        LOWEST  DETECTABLE LIMITS FOR URINE DRUG SCREEN Drug Class       Cutoff (ng/mL) Amphetamine      1000 Barbiturate      200 Benzodiazepine   675 Tricyclics       916 Opiates          300 Cocaine          300 THC              50     Current Facility-Administered Medications  Medication Dose Route Frequency Provider Last Rate Last Dose  . divalproex (DEPAKOTE) DR tablet 500 mg  500 mg Oral BID Doristine Devoid, PA-C   500 mg at 10/19/16 2111  . ibuprofen (ADVIL,MOTRIN) tablet 600 mg  600 mg Oral Q8H PRN Ocie Cornfield T, PA-C      . nicotine (NICODERM CQ - dosed in mg/24 hours) patch 21 mg  21 mg Transdermal Daily Leaphart, Kenneth T, PA-C      . risperiDONE (RISPERDAL) tablet 1 mg  1 mg Oral BID Ocie Cornfield T, PA-C   1 mg at 10/19/16 2112  . traZODone (DESYREL) tablet 50 mg  50 mg Oral QHS Doristine Devoid, PA-C   50 mg at 10/19/16 2111   Current Outpatient Prescriptions  Medication Sig Dispense Refill  . divalproex (DEPAKOTE) 500 MG DR tablet Take 500 mg by mouth 2 (two) times daily.    . risperiDONE (RISPERDAL) 1 MG tablet Take 1 mg by mouth 2 (two) times daily.    . traZODone (DESYREL) 50 MG tablet Take 50 mg by mouth at bedtime.      Musculoskeletal: Strength & Muscle Tone: within normal limits Gait & Station: normal Patient leans: N/A  Psychiatric Specialty Exam: Physical Exam  Constitutional: He is oriented to person, place, and time. He appears well-developed and well-nourished.  HENT:  Head: Normocephalic.  Neck: Normal range of motion.  Respiratory: Effort normal.  Musculoskeletal: Normal range of motion.   Neurological: He is alert and oriented to person, place, and time.  Psychiatric: Judgment normal. His mood appears anxious. His affect is blunt. His speech is delayed. He is actively hallucinating. Thought content is paranoid. Cognition and memory are impaired.    Review of Systems  Psychiatric/Behavioral: Positive for hallucinations.  All other systems reviewed and are negative.   Blood pressure (!) 132/93, pulse 100, temperature 97.9 F (36.6 C), temperature source Oral, resp. rate 16, SpO2 100 %.There is no height or weight on file to calculate BMI.  General Appearance: Casual  Eye Contact:  Fair  Speech:  Normal Rate  Volume:  Normal, slow  Mood:  Anxious  Affect:  Flat  Thought Process:  Coherent and Descriptions of Associations: Intact  Orientation:  Full (Time, Place, and Person)  Thought Content:  Hallucinations: Auditory and Paranoid Ideation  Suicidal Thoughts:  No  Homicidal Thoughts:  No  Memory:  Immediate;   Fair Recent;   Fair Remote;   Fair  Judgement:  Impaired  Insight:  Fair  Psychomotor Activity:  Decreased  Concentration:  Concentration: Fair and Attention Span: Fair  Recall:  [employer] of Knowledge:  Fair  Language:  Good  Akathisia:  No  Handed:  Right  AIMS (if indicated):     Assets:  Housing Leisure Time Physical Health Resilience Social Support  ADL's:  Intact  Cognition:  Impaired,  Mild  Sleep:        Treatment Plan Summary: Daily contact with patient to assess and evaluate  symptoms and progress in treatment, Medication management and Plan schizophrenia, paranoid type:  -Crisis stabilization -Medication management:  Started Risperdal 1 mg BID for psychosis, Depakote 500 mg BID for mood stabilization, and Trazodone 100 mg at bedtime for sleep. -Individual counseling  Disposition: Recommend psychiatric Inpatient admission when medically cleared.  Waylan Boga, NP 10/20/2016 10:53 AM  Patient seen face-to-face for psychiatric  evaluation, chart reviewed and case discussed with the physician extender and developed treatment plan. Reviewed the information documented and agree with the treatment plan. Corena Pilgrim, MD

## 2016-10-20 NOTE — ED Notes (Signed)
Therapeutic conversation with patient and this Clinical research associatewriter.  States "I just feel like I want to be a man".  Support and encouragment offered.  Spiritual support offered.  POC reviewed.  15' checks cont for safety.
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
## 2016-10-20 NOTE — [facility] Counselor (Signed)
Clinician faxed requested documentation to [facility] ([phone]   Redmond Pullingreylese D Zedrick Springsteen, MS, [facility], [facility] Triage Specialist [phone]

## 2016-10-20 NOTE — ED Notes (Signed)
SBAR Report received from previous nurse. Pt received calm and visible on unit. Pt denies current SI/ HI, A/V H, depression, anxiety, or pain at this time, and appears otherwise stable and free of distress. Pt reminded of camera surveillance, q 15 min rounds, and rules of the milieu. Will continue to assess. 

## 2016-10-21 MED ORDER — TRAZODONE HCL 100 MG PO TABS: 100.0000 mg | ORAL_TABLET | Freq: Every day | ORAL | 0 refills | Status: DC

## 2016-10-21 MED ORDER — RISPERIDONE 1 MG PO TABS: 1.0000 mg | ORAL_TABLET | Freq: Two times a day (BID) | ORAL | 0 refills | Status: DC

## 2016-10-21 MED ORDER — DIVALPROEX SODIUM 500 MG PO DR TAB: 500.0000 mg | DELAYED_RELEASE_TABLET | Freq: Two times a day (BID) | ORAL | 0 refills | Status: DC

## 2016-10-21 NOTE — BH Assessment (Signed)
[facility] Assessment Progress Note  Per Mojeed Akintayo, MD, this pt does not require psychiatric hospitalization at this time.  Pt is to be discharged from [facility] with recommendation to continue treatment with [facility].  This has been included in pt's discharge instructions.  Pt's nurse, Ashley, has been notified.  Yorley Buch, MA Triage Specialist [phone]       

## 2016-10-21 NOTE — ED Notes (Signed)
[facility] called and have declined pt at this time. TTS informed.

## 2016-10-21 NOTE — [facility] Suicide Risk Assessment (Signed)
Suicide Risk Assessment  Discharge Assessment   [facility]   Principal Problem: Schizophrenia, paranoid type [facility](HCC) Discharge Diagnoses:  Patient Active Problem List   Diagnosis Date Noted  . Schizophrenia, paranoid type (HCC) [F20.0] 10/20/2016    Priority: High    Total Time spent with patient: 45 minutes   Musculoskeletal: Strength & Muscle Tone: within normal limits Gait & Station: normal Patient leans: N/A  Psychiatric Specialty Exam: Physical Exam  Constitutional: He is oriented to person, place, and time. He appears well-developed and well-nourished.  HENT:  Head: Normocephalic.  Neck: Normal range of motion.  Respiratory: Effort normal.  Musculoskeletal: Normal range of motion.  Neurological: He is alert and oriented to person, place, and time.  Psychiatric: His speech is normal and behavior is normal. Judgment and thought content normal. His affect is blunt. Cognition and memory are normal.    Review of Systems  All other systems reviewed and are negative.   Blood pressure (!) 146/66, pulse 62, temperature 98.1 F (36.7 C), temperature source Oral, resp. rate 16, SpO2 100 %.There is no height or weight on file to calculate BMI.  General Appearance: Casual  Eye Contact:  Good  Speech:  Normal Rate  Volume:  Normal  Mood:  Euthymic   Affect:  Flat  Thought Process:  Coherent and Descriptions of Associations: Intact  Orientation:  Full (Time, Place, and Person)  Thought Content:  WDL  Suicidal Thoughts:  No  Homicidal Thoughts:  No  Memory:  Immediate, good; recent, good; remote, good  Judgement:  Fair  Insight:  Good  Psychomotor Activity:  None  Concentration:  Concentration and attention span:  Good  Recall:  Good  Fund of Knowledge:  Good  Language:  Good  Akathisia:  No  Handed:  Right  AIMS (if indicated):     Assets:  Housing Leisure Time Physical Health Resilience Social Support  ADL's:  Intact  Cognition:  WDL   Sleep:       Mental Status Per Nursing Assessment::   On Admission:   psychosis  Demographic Factors:  Male and Living alone  Loss Factors: NA  Historical Factors: NA  Risk Reduction Factors:   Sense of responsibility to family, Positive social support and Positive therapeutic relationship  Continued Clinical Symptoms:  None  Cognitive Features That Contribute To Risk:  None    Suicide Risk:  Minimal: No identifiable suicidal ideation.  Patients presenting with no risk factors but with morbid ruminations; may be classified as minimal risk based on the severity of the depressive symptoms    Plan Of Care/Follow-up recommendations:  Activity:  as tolerated Diet:  heart healthy diet  LORD, JAMISON, NP 10/21/2016, 10:35 AM

## 2016-10-21 NOTE — Discharge Instructions (Signed)
For your ongoing mental health needs, you are advised to continue treatment with [facility]: ° °     [facility] °     [address] °     , [place] [address] °     [phone] °

## 2016-10-21 NOTE — Consult Note (Signed)
[facility] Consult   Reason for Consult:  Psychosis  Referring Physician:  EDP Patient Identification: Paul Mendez MRN:  [id number] Principal Diagnosis: Schizophrenia, paranoid type [facility]) Diagnosis:   Patient Active Problem List   Diagnosis Date Noted  . Schizophrenia, paranoid type ([place]) [F20.0] 10/20/2016    Priority: High    Total Time spent with patient: 30 minutes  Subjective:   Paul Mendez is a 31 y.o. male patient has stabilized.  HPI:  31 yo male presented to the ED under IVC for psychosis from [employer] where he went for medications.  Medications were restarted and he stabilized.  No psychosis or suicidal/homicidal ideations, no alcohol/drug abuse.  Stable for discharge.  Past Psychiatric History: schizophrenia  Risk to Self: NOne Risk to Others: Homicidal Ideation: No Thoughts of Harm to Others: No Current Homicidal Intent: No Current Homicidal Plan: No Access to Homicidal Means: No Identified Victim: NA History of harm to others?: No Assessment of Violence: None Noted Violent Behavior Description: NA Does patient have access to weapons?: No Criminal Charges Pending?: No Does patient have a court date: No Prior Inpatient Therapy: Prior Inpatient Therapy: Yes Prior Therapy Dates: 2018 Prior Therapy Facilty/Provider(s): [facility] Reason for Treatment: MH issues, S/I Prior Outpatient Therapy: Prior Outpatient Therapy: Yes Prior Therapy Dates: Ongoing Prior Therapy Facilty/Provider(s): [facility] Reason for Treatment: Med mang Does patient have an ACCT team?: No Does patient have Intensive In-House Services?  : No Does patient have [facility] services? : Yes Does patient have P4CC services?: No  Past Medical History:  Past Medical History:  Diagnosis Date  . Genital warts   . Schizophrenia ([place])    History reviewed. No pertinent surgical history. Family History: History reviewed. No pertinent family history. Family Psychiatric  History: unknown Social  History:  History  Alcohol Use  . Yes    Comment: occ     History  Drug Use No    Social History   Social History  . Marital status: Single    Spouse name: N/A  . Number of children: N/A  . Years of education: N/A   Social History Main Topics  . Smoking status: Current Some Day Smoker    Types: Cigarettes  . Smokeless tobacco: Never Used  . Alcohol use Yes     Comment: occ  . Drug use: No  . Sexual activity: Not Asked   Other Topics Concern  . None   Social History Narrative  . None   Additional Social History:    Allergies:  No Known Allergies  Labs:  Results for orders placed or performed during the hospital encounter of 10/19/16 (from the past 48 hour(s))  Comprehensive metabolic panel     Status: Abnormal   Collection Time: 10/19/16  2:56 PM  Result Value Ref Range   Sodium 140 135 - 145 mmol/L   Potassium 4.1 3.5 - 5.1 mmol/L   Chloride 105 101 - 111 mmol/L   CO2 25 22 - 32 mmol/L   Glucose, Bld 108 (H) 65 - 99 mg/dL   BUN 14 6 - 20 mg/dL   Creatinine, Ser 0.99 0.61 - 1.24 mg/dL   Calcium 9.6 8.9 - 10.3 mg/dL   Total Protein 7.9 6.5 - 8.1 g/dL   Albumin 4.6 3.5 - 5.0 g/dL   AST 21 15 - 41 U/L   ALT 15 (L) 17 - 63 U/L   Alkaline Phosphatase 71 38 - 126 U/L   Total Bilirubin 0.3 0.3 - 1.2 mg/dL   GFR  calc non Af Amer >60 >60 mL/min   GFR calc Af Amer >60 >60 mL/min    Comment: (NOTE) The eGFR has been calculated using the CKD EPI equation. This calculation has not been validated in all clinical situations. eGFR's persistently <60 mL/min signify possible Chronic Kidney Disease.    Anion gap 10 5 - 15  CBC with Diff     Status: None   Collection Time: 10/19/16  2:56 PM  Result Value Ref Range   WBC 8.1 4.0 - 10.5 K/uL   RBC 4.51 4.22 - 5.81 MIL/uL   Hemoglobin 14.6 13.0 - 17.0 g/dL   HCT 41.0 39.0 - 52.0 %   MCV 90.9 78.0 - 100.0 fL   MCH 32.4 26.0 - 34.0 pg   MCHC 35.6 30.0 - 36.0 g/dL   RDW 12.2 11.5 - 15.5 %   Platelets 304 150 - 400  K/uL   Neutrophils Relative % 67 %   Neutro Abs 5.5 1.7 - 7.7 K/uL   Lymphocytes Relative 19 %   Lymphs Abs 1.6 0.7 - 4.0 K/uL   Monocytes Relative 12 %   Monocytes Absolute 1.0 0.1 - 1.0 K/uL   Eosinophils Relative 2 %   Eosinophils Absolute 0.1 0.0 - 0.7 K/uL   Basophils Relative 0 %   Basophils Absolute 0.0 0.0 - 0.1 K/uL  Ethanol     Status: None   Collection Time: 10/19/16  4:40 PM  Result Value Ref Range   Alcohol, Ethyl (B) <5 <5 mg/dL    Comment:        LOWEST DETECTABLE LIMIT FOR SERUM ALCOHOL IS 5 mg/dL FOR MEDICAL PURPOSES ONLY   Salicylate level     Status: None   Collection Time: 10/19/16  4:40 PM  Result Value Ref Range   Salicylate Lvl <1.0 2.8 - 30.0 mg/dL  Acetaminophen level     Status: Abnormal   Collection Time: 10/19/16  4:40 PM  Result Value Ref Range   Acetaminophen (Tylenol), Serum <10 (L) 10 - 30 ug/mL    Comment:        THERAPEUTIC CONCENTRATIONS VARY SIGNIFICANTLY. A RANGE OF 10-30 ug/mL MAY BE AN EFFECTIVE CONCENTRATION FOR MANY PATIENTS. HOWEVER, SOME ARE BEST TREATED AT CONCENTRATIONS OUTSIDE THIS RANGE. ACETAMINOPHEN CONCENTRATIONS >150 ug/mL AT 4 HOURS AFTER INGESTION AND >50 ug/mL AT 12 HOURS AFTER INGESTION ARE OFTEN ASSOCIATED WITH TOXIC REACTIONS.   Urine rapid drug screen (hosp performed)     Status: None   Collection Time: 10/19/16  7:35 PM  Result Value Ref Range   Opiates NONE DETECTED NONE DETECTED   Cocaine NONE DETECTED NONE DETECTED   Benzodiazepines NONE DETECTED NONE DETECTED   Amphetamines NONE DETECTED NONE DETECTED   Tetrahydrocannabinol NONE DETECTED NONE DETECTED   Barbiturates NONE DETECTED NONE DETECTED    Comment:        DRUG SCREEN FOR MEDICAL PURPOSES ONLY.  IF CONFIRMATION IS NEEDED FOR ANY PURPOSE, NOTIFY LAB WITHIN 5 DAYS.        LOWEST DETECTABLE LIMITS FOR URINE DRUG SCREEN Drug Class       Cutoff (ng/mL) Amphetamine      1000 Barbiturate      200 Benzodiazepine   626 Tricyclics        948 Opiates          300 Cocaine          300 THC              50  Current Facility-Administered Medications  Medication Dose Route Frequency Provider Last Rate Last Dose  . divalproex (DEPAKOTE) DR tablet 500 mg  500 mg Oral BID Ocie Cornfield T, PA-C   500 mg at 10/21/16 0920  . ibuprofen (ADVIL,MOTRIN) tablet 600 mg  600 mg Oral Q8H PRN Ocie Cornfield T, PA-C      . nicotine (NICODERM CQ - dosed in mg/24 hours) patch 21 mg  21 mg Transdermal Daily Leaphart, Kenneth T, PA-C      . risperiDONE (RISPERDAL) tablet 1 mg  1 mg Oral BID Ocie Cornfield T, PA-C   1 mg at 10/21/16 0920  . traZODone (DESYREL) tablet 100 mg  100 mg Oral QHS Patrecia Pour, NP   100 mg at 10/20/16 2116   Current Outpatient Prescriptions  Medication Sig Dispense Refill  . divalproex (DEPAKOTE) 500 MG DR tablet Take 500 mg by mouth 2 (two) times daily.    . risperiDONE (RISPERDAL) 1 MG tablet Take 1 mg by mouth 2 (two) times daily.    . traZODone (DESYREL) 50 MG tablet Take 50 mg by mouth at bedtime.      Musculoskeletal: Strength & Muscle Tone: within normal limits Gait & Station: normal Patient leans: N/A  Psychiatric Specialty Exam: Physical Exam  Constitutional: He is oriented to person, place, and time. He appears well-developed and well-nourished.  HENT:  Head: Normocephalic.  Neck: Normal range of motion.  Respiratory: Effort normal.  Musculoskeletal: Normal range of motion.  Neurological: He is alert and oriented to person, place, and time.  Psychiatric: His speech is normal and behavior is normal. Judgment and thought content normal. His affect is blunt. Cognition and memory are normal.    Review of Systems  All other systems reviewed and are negative.   Blood pressure (!) 146/66, pulse 62, temperature 98.1 F (36.7 C), temperature source Oral, resp. rate 16, SpO2 100 %.There is no height or weight on file to calculate BMI.  General Appearance: Casual  Eye Contact:  Good   Speech:  Normal Rate  Volume:  Normal  Mood:  Euthymic   Affect:  Flat  Thought Process:  Coherent and Descriptions of Associations: Intact  Orientation:  Full (Time, Place, and Person)  Thought Content:  WDL  Suicidal Thoughts:  No  Homicidal Thoughts:  No  Memory:  Immediate, good; recent, good; remote, good  Judgement:  Fair  Insight:  Good  Psychomotor Activity:  None  Concentration:  Concentration and attention span:  Good  Recall:  Good  Fund of Knowledge:  Good  Language:  Good  Akathisia:  No  Handed:  Right  AIMS (if indicated):     Assets:  Housing Leisure Time Physical Health Resilience Social Support  ADL's:  Intact  Cognition:  WDL  Sleep:        Treatment Plan Summary: Daily contact with patient to assess and evaluate symptoms and progress in treatment, Medication management and Plan schizophrenia, paranoid type:  -Crisis stabilization -Medication management:  Continued Risperdal 1 mg BID for psychosis, Depakote 500 mg BID for mood stabilization, and Trazodone 100 mg at bedtime for sleep. -Individual counseling -Rx provided  Disposition: Discharge home  Waylan Boga, NP 10/21/2016 10:30 AM  Patient seen face-to-face for psychiatric evaluation, chart reviewed and case discussed with the physician extender and developed treatment plan. Reviewed the information documented and agree with the treatment plan. Corena Pilgrim, MD

## 2016-10-21 NOTE — ED Notes (Addendum)
Pt d/c home per MD order. Discharge summary reviewed with pt. Pt verbalizes understanding. RX provided. Pt denies SI/HI/AVH. Personal property returned. Pt signed e-signature. Pt ambulatory off unit with [facility], discharged, family in lobby.

## 2016-10-24 ENCOUNTER — Encounter (HOSPITAL_COMMUNITY): Payer: Self-pay | Admitting: Emergency Medicine

## 2016-10-24 ENCOUNTER — Emergency Department (HOSPITAL_COMMUNITY)
Admission: EM | Admit: 2016-10-24 | Discharge: 2016-10-25 | Disposition: A | Discharge status: Other Acute Inpatient Hospital | Payer: Federal, State, Local not specified - Other | Attending: Emergency Medicine | Admitting: Emergency Medicine

## 2016-10-24 DIAGNOSIS — Z720 Tobacco use: Secondary | ICD-10-CM

## 2016-10-24 DIAGNOSIS — F17210 Nicotine dependence, cigarettes, uncomplicated: Secondary | ICD-10-CM | POA: Insufficient documentation

## 2016-10-24 DIAGNOSIS — Z008 Encounter for other general examination: Secondary | ICD-10-CM

## 2016-10-24 DIAGNOSIS — R4689 Other symptoms and signs involving appearance and behavior: Secondary | ICD-10-CM

## 2016-10-24 DIAGNOSIS — F200 Paranoid schizophrenia: Secondary | ICD-10-CM | POA: Insufficient documentation

## 2016-10-24 DIAGNOSIS — R4589 Other symptoms and signs involving emotional state: Secondary | ICD-10-CM | POA: Insufficient documentation

## 2016-10-24 LAB — CBC WITH DIFFERENTIAL/PLATELET
BASOS ABS: 0 10*3/uL (ref 0.0–0.1)
BASOS PCT: 1 %
EOS ABS: 0.1 10*3/uL (ref 0.0–0.7)
Eosinophils Relative: 1 %
HEMATOCRIT: 40 % (ref 39.0–52.0)
HEMOGLOBIN: 14.2 g/dL (ref 13.0–17.0)
Lymphocytes Relative: 31 %
Lymphs Abs: 2 10*3/uL (ref 0.7–4.0)
MCH: 32.3 pg (ref 26.0–34.0)
MCHC: 35.5 g/dL (ref 30.0–36.0)
MCV: 91.1 fL (ref 78.0–100.0)
MONOS PCT: 14 %
Monocytes Absolute: 0.9 10*3/uL (ref 0.1–1.0)
NEUTROS ABS: 3.4 10*3/uL (ref 1.7–7.7)
NEUTROS PCT: 53 %
Platelets: 296 10*3/uL (ref 150–400)
RBC: 4.39 MIL/uL (ref 4.22–5.81)
RDW: 12.2 % (ref 11.5–15.5)
WBC: 6.4 10*3/uL (ref 4.0–10.5)

## 2016-10-24 LAB — COMPREHENSIVE METABOLIC PANEL
ALBUMIN: 4.7 g/dL (ref 3.5–5.0)
ALK PHOS: 53 U/L (ref 38–126)
ALT: 15 U/L — AB (ref 17–63)
ANION GAP: 9 (ref 5–15)
AST: 18 U/L (ref 15–41)
BILIRUBIN TOTAL: 0.8 mg/dL (ref 0.3–1.2)
BUN: 18 mg/dL (ref 6–20)
CALCIUM: 9.6 mg/dL (ref 8.9–10.3)
CO2: 28 mmol/L (ref 22–32)
Chloride: 102 mmol/L (ref 101–111)
Creatinine, Ser: 1.25 mg/dL — ABNORMAL HIGH (ref 0.61–1.24)
GFR calc Af Amer: >60 mL/min (ref >60)
GFR calc non Af Amer: >60 mL/min (ref >60)
Glucose, Bld: 103 mg/dL — ABNORMAL HIGH (ref 65–99)
Potassium: 4 mmol/L (ref 3.5–5.1)
SODIUM: 139 mmol/L (ref 135–145)
TOTAL PROTEIN: 7.7 g/dL (ref 6.5–8.1)

## 2016-10-24 LAB — RAPID URINE DRUG SCREEN, HOSP PERFORMED
AMPHETAMINES: NOT DETECTED
BENZODIAZEPINES: NOT DETECTED
Barbiturates: NOT DETECTED
Cocaine: NOT DETECTED
Opiates: NOT DETECTED
TETRAHYDROCANNABINOL: NOT DETECTED

## 2016-10-24 LAB — ACETAMINOPHEN LEVEL

## 2016-10-24 LAB — ETHANOL: Alcohol, Ethyl (B): <5 mg/dL (ref <5)

## 2016-10-24 LAB — SALICYLATE LEVEL: Salicylate Lvl: <7 mg/dL (ref 2.8–30.0)

## 2016-10-24 MED ORDER — ACETAMINOPHEN 325 MG PO TABS: 650.0000 mg | ORAL_TABLET | ORAL | Status: DC | PRN

## 2016-10-24 MED ORDER — DIVALPROEX SODIUM 500 MG PO DR TAB
500.0000 mg | DELAYED_RELEASE_TABLET | Freq: Two times a day (BID) | ORAL | Status: DC
  Administered 2016-10-24 – 2016-10-25 (×2): 500 mg via ORAL
  Filled 2016-10-24 (×2): qty 1

## 2016-10-24 MED ORDER — ALUM & MAG HYDROXIDE-SIMETH 200-200-20 MG/5ML PO SUSP: 30.0000 mL | Freq: Four times a day (QID) | ORAL | Status: DC | PRN

## 2016-10-24 MED ORDER — NICOTINE 21 MG/24HR TD PT24
21.0000 mg | MEDICATED_PATCH | Freq: Every day | TRANSDERMAL | Status: DC
  Filled 2016-10-24: qty 1

## 2016-10-24 MED ORDER — TRAZODONE HCL 100 MG PO TABS
100.0000 mg | ORAL_TABLET | Freq: Every day | ORAL | Status: DC
  Administered 2016-10-24: 100 mg via ORAL
  Filled 2016-10-24: qty 1

## 2016-10-24 MED ORDER — ZOLPIDEM TARTRATE 5 MG PO TABS: 5.0000 mg | ORAL_TABLET | Freq: Every evening | ORAL | Status: DC | PRN

## 2016-10-24 MED ORDER — RISPERIDONE 1 MG PO TABS
1.0000 mg | ORAL_TABLET | Freq: Two times a day (BID) | ORAL | Status: DC
  Administered 2016-10-24 – 2016-10-25 (×2): 1 mg via ORAL
  Filled 2016-10-24 (×2): qty 1

## 2016-10-24 MED ORDER — ONDANSETRON HCL 4 MG PO TABS: 4.0000 mg | ORAL_TABLET | Freq: Three times a day (TID) | ORAL | Status: DC | PRN

## 2016-10-24 NOTE — ED Provider Notes (Signed)
[facility]-EMERGENCY DEPT Provider Note   CSN: [id number] Arrival date & time: 10/24/16  1740     History   Chief Complaint Chief Complaint  Patient presents with  . Medical Clearance    HPI Paul Mendez is a 31 y.o. male with a PMHx of paranoid schizophrenia, who presents to the ED via [employer] for evaluation of aggressive outbursts. Level 5 caveat due to psychiatric condition, pt tangential, doesn't always answer questions. [employer] assists in HPI and provide some of the history. They report that patient's mother called them because the pt was banging on her door trying to get in; at the same time the patient himself called [employer] stating that he was worried about his mother and needed to get to her. They reported that he seems to have paranoid delusions about someone trying to hurt his family so he gets aggressive in an attempt to try to get inside of their home. Per [employer], pt's mother is on the way to the magistrate's office to take out IVC paperwork now. When asked why the patient is here, he states "I'm a boring person, I'm used to people talking to me" and "I wish I could stop talking". He also states "I just want my family to be okay". He does not go into any details, and occasionally gets distracted during the interview and stops talking for a few seconds. He denies SI, HI, AVH, drug use, or EtOH use. Admits tobacco smoking. Reports he's compliant with his Depakote 500mg  BID, Risperdal 1mg  BID, and Trazodone 100mg  QHS; he last took his depakote/risperdal at 4am today, and trazodone at 8pm yesterday. When asked if anything else is hurting him or bothering him, he is tangential and rambles about other things such as being worried about his family; he is therefore difficult to get full HPI/ROS from, but denies any complaints including fevers, CP, SOB, abd pain, n/v/d/c, SI, HI, or AVH. Full ROS unable to be done due to pt's psychiatric condition limiting evaluation.   Of note, chart review reveals he was seen in  the ED 10/19/16 for hallucinations, denied SI/HI, had med clearance labs which were unremarkable, was assessed by TTS who deemed that he was appropriate for outpatient f/up care with [facility] and discharged him.    The history is provided by the patient, medical records and the police. The history is limited by the condition of the patient. No language interpreter was used.  Mental Health Problem  Presenting symptoms: agitation (per [employer]), delusional and paranoid behavior   Presenting symptoms: no hallucinations, no homicidal ideas and no suicidal thoughts   Patient accompanied by:  Law enforcement Degree of incapacity (severity):  Moderate Onset quality:  Unable to specify Timing:  Unable to specify Progression:  Worsening Chronicity:  Recurrent Treatment compliance:  All of the time Time since last psychoactive medication taken:  14 hours Relieved by:  None tried Worsened by:  Nothing Ineffective treatments:  None tried Associated symptoms: no abdominal pain and no chest pain   Risk factors: hx of mental illness and recent psychiatric admission     Past Medical History:  Diagnosis Date  . Genital warts   . Schizophrenia [facility])     Patient Active Problem List   Diagnosis Date Noted  . Schizophrenia, paranoid type ([facility]) 10/20/2016    History reviewed. No pertinent surgical history.     Home Medications    Prior to Admission medications   Medication Sig Start Date End Date Taking? Authorizing Provider  divalproex (DEPAKOTE) 500  MG DR tablet Take 1 tablet (500 mg total) by mouth 2 (two) times daily. 10/21/16   Charm RingsLord, Jamison Y, NP  risperiDONE (RISPERDAL) 1 MG tablet Take 1 tablet (1 mg total) by mouth 2 (two) times daily. 10/21/16 11/20/16  Charm RingsLord, Jamison Y, NP  traZODone (DESYREL) 100 MG tablet Take 1 tablet (100 mg total) by mouth at bedtime. 10/21/16   Charm RingsLord, Jamison Y, NP  traZODone (DESYREL) 50 MG tablet Take 50 mg by mouth at bedtime. 10/16/16   [provider]     Family History No family history on file.  Social History Social History  Substance Use Topics  . Smoking status: Current Some Day Smoker    Types: Cigarettes  . Smokeless tobacco: Never Used  . Alcohol use Yes     Comment: occ     Allergies   Patient has no known allergies.   Review of Systems Review of Systems  Unable to perform ROS: Psychiatric disorder  Constitutional: Negative for fever.  Respiratory: Negative for shortness of breath.   Cardiovascular: Negative for chest pain.  Gastrointestinal: Negative for abdominal pain, constipation, diarrhea, nausea and vomiting.  Musculoskeletal: Negative for arthralgias and myalgias.  Allergic/Immunologic: Negative for immunocompromised state.  Psychiatric/Behavioral: Positive for agitation (per [employer]) and paranoia. Negative for hallucinations, homicidal ideas and suicidal ideas.   Level 5 caveat due to psychiatric condition, pt tangential, doesn't always answer questions, but denies any complaints aside from those mentioned above  Physical Exam Updated Vital Signs BP (!) 137/94 (BP Location: Right Arm)   Pulse 93   Temp 98.5 F (36.9 C) (Oral)   Resp 20   SpO2 100%   Physical Exam  Constitutional: He is oriented to person, place, and time. Vital signs are normal. He appears well-developed and well-nourished.  Non-toxic appearance. No distress.  Afebrile, nontoxic, NAD  HENT:  Head: Normocephalic and atraumatic.  Mouth/Throat: Oropharynx is clear and moist and mucous membranes are normal.  Eyes: Conjunctivae and EOM are normal. Right eye exhibits no discharge. Left eye exhibits no discharge.  Neck: Normal range of motion. Neck supple.  Cardiovascular: Normal rate, regular rhythm, normal heart sounds and intact distal pulses.  Exam reveals no gallop and no friction rub.   No murmur heard. Pulmonary/Chest: Effort normal and breath sounds normal. No respiratory distress. He has no decreased breath sounds. He has no  wheezes. He has no rhonchi. He has no rales.  Abdominal: Soft. Normal appearance and bowel sounds are normal. He exhibits no distension. There is no tenderness. There is no rigidity, no rebound, no guarding, no CVA tenderness, no tenderness at McBurney's point and negative Murphy's sign.  Musculoskeletal: Normal range of motion.  Neurological: He is alert and oriented to person, place, and time. He has normal strength. No sensory deficit.  Skin: Skin is warm, dry and intact. No rash noted.  Psychiatric: His affect is blunt. His speech is tangential. He is actively hallucinating. He expresses no homicidal and no suicidal ideation. He expresses no suicidal plans and no homicidal plans.  Blunted affect, at times he stares forward and doesn't answer questions for a few seconds, but then makes eye contact again and will apologize for getting distracted; seems to be responding to internal stimuli in those moments, but hard to say if it's auditory or visual hallucination. Tangential at times but overall still pleasant and mostly cooperative, requires redirecting at times. Denies SI, HI, or AVH  Nursing note and vitals reviewed.    ED Treatments / Results  Labs (all labs ordered are listed, but only abnormal results are displayed) Labs Reviewed  COMPREHENSIVE METABOLIC PANEL - Abnormal; Notable for the following:       Result Value   Glucose, Bld 103 (*)    Creatinine, Ser 1.25 (*)    ALT 15 (*)    All other components within normal limits  ACETAMINOPHEN LEVEL - Abnormal; Notable for the following:    Acetaminophen (Tylenol), Serum <10 (*)    All other components within normal limits  CBC WITH DIFFERENTIAL/PLATELET  ETHANOL  SALICYLATE LEVEL  RAPID URINE DRUG SCREEN, HOSP PERFORMED    EKG  EKG Interpretation None       Radiology No results found.  Procedures Procedures (including critical care time)  Medications Ordered in ED Medications  divalproex (DEPAKOTE) DR tablet 500 mg  (not administered)  risperiDONE (RISPERDAL) tablet 1 mg (not administered)  traZODone (DESYREL) tablet 100 mg (not administered)  acetaminophen (TYLENOL) tablet 650 mg (not administered)  zolpidem (AMBIEN) tablet 5 mg (not administered)  ondansetron (ZOFRAN) tablet 4 mg (not administered)  alum & mag hydroxide-simeth (MAALOX/MYLANTA) 200-200-20 MG/5ML suspension 30 mL (not administered)  nicotine (NICODERM CQ - dosed in mg/24 hours) patch 21 mg (not administered)     Initial Impression / Assessment and Plan / ED Course  I have reviewed the triage vital signs and the nursing notes.  Pertinent labs & imaging results that were available during my care of the patient were reviewed by me and considered in my medical decision making (see chart for details).     31 y.o. male here after he called [employer] stating he was worried about his mother, and apparently was banging on her door trying to get into her apartment. Per their report, mother is taking out IVC paperwork now. On exam, seems distracted by something, unclear if it's an auditory or visual hallucinations, but he stares off into the distance and doesn't answer for a few seconds. He is tangential at times, stating he just wants his family to be ok, wants to stop talking, etc. Denies SI/HI/AVH, drug use, or EtOH use. +Smoker, cessation advised. Compliant with meds. Was recently seen here 10/19/16 and psychiatric team felt he was safe for d/c back to [facility] for ongoing outpatient care. Will get clearance labs and reassess shortly  7:16 PM EtOH level undetectable. CBC w/diff WNL. CMP with mildly elevated Cr 1.25, but similar to prior values; likely mild dehydration, doubt need for further emergent work up, will have them encouraged PO hydration while he's here. Salicylate and acetaminophen levels WNL. UDS negative. Pt medically cleared at this time. Psych hold orders and home med orders placed. Please see TTS notes for further documentation of  care/dispo. Pt stable at time of med clearance.     Final Clinical Impressions(s) / ED Diagnoses   Final diagnoses:  Aggressive behavior  Paranoid schizophrenia ([facility])  Tobacco user  Medical clearance for psychiatric admission    New Prescriptions New Prescriptions   No medications on [address] [place], [place] 10/24/16 1917    Clarene Duke Ambrose Finland, MD 10/25/16 [phone]

## 2016-10-24 NOTE — ED Notes (Signed)
Wanded by security 

## 2016-10-24 NOTE — ED Notes (Signed)
Pt presents under IVC, with aggressive behavior towards mom.  Denies SI, HI or AVH.  A&O x 3, no distress noted, calm & cooperative.  Monitoring for safety, Q 15 min checks in effect.  Safety check for contraband completed, no items found.

## 2016-10-24 NOTE — BH Assessment (Addendum)
Tele Assessment Note   Paul Mendez is an 31 y.o. male, who presents involuntarily and unaccompanied to [facility]. Clinician asked the pt, "What brought you to the hospital?" Pt responded, "I just needed to be around some people." Pt denied SI, HI, self-injurious behaviors and access to weapons. Clinician asked if the pt had experienced any hallucinations or delusions?  Pt replied,"I think I do, I see people get murdered. I just heard somebody get murdered."  Pt reported, "I was trying to be successful. I don't know if I'm having a vision." Pt reported, hearing the act of someone getting murdered.  Pt was IVC'd by his mother: "Respondent has been diagnosed with bipolar and schizophrenia. He refuses to taking his medication as prescribed by physical. He isn't eating or sleep[ing. The respondent was reviewed by mobile crisis and they suggested he seek further treatment. Respondent is having suicidal thoughts. He has left voicemail on his sisters phone stating "I'm going to kill you all." Respondent hears voices and believes that he is talking to someone that is not there. Today the respondent showed up to his mother's house trying to barge in, even when the police was present Family is afraid of the respondent."   Pt denied abuse. Pt reported, smoking a pack of cigarettes daily. Pt's UDS and BAL are negative. Pt reported, he received medication management with Merlyn Albert. Pt reported, taking his medication as prescribed. Pt reported, previous inpatient admissions.   Pt presents quiet/awae in scrubs with soft/sloe speech. Pt's eye contact was fair. Pt's mood was preoccupied. Pt's affect was flat. Pt's thought process was thought blocking. Pt's judgement was partial. Pt's concentration was fair. Pt's insight and impulse control are poor. Pt was oriented x4 (day, year, city and state.) Pt reported, if discharged from [facility] he could contract for safety.   Diagnosis: Schizophrenia [facility])   Past Medical History:  Past  Medical History:  Diagnosis Date  . Genital warts   . Schizophrenia (HCC)     History reviewed. No pertinent surgical history.  Family History: No family history on file.  Social History:  reports that he has been smoking Cigarettes.  He has never used smokeless tobacco. He reports that he drinks alcohol. He reports that he does not use drugs.  Additional Social History:  Alcohol / Drug Use Pain Medications: See MAR Prescriptions: See MAR Over the Counter: See MAR History of alcohol / drug use?: No history of alcohol / drug abuse Substance #1 Name of Substance 1: Cigarettes 1 - Age of First Use: UTA 1 - Amount (size/oz): Pt reported, smoking a pack per day.  1 - Frequency: UTA 1 - Duration: UTA 1 - Last Use / Amount: Pt reported, per day.   CIWA: CIWA-Ar BP: (!) 123/91 Pulse Rate: 79 COWS:    PATIENT STRENGTHS: (choose at least two) Average or above average intelligence Supportive family/friends  Allergies: No Known Allergies  Home Medications:  (Not in a hospital admission)  OB/GYN Status:  No LMP for male patient.  General Assessment Data [facility] of Assessment: [facility] TTS Assessment: In system Is this a Tele or Face-to-Face Assessment?: Face-to-Face Is this an Initial Assessment or a Re-assessment for this encounter?: Initial Assessment Marital status: Single Living Arrangements: Alone Can pt return to current living arrangement?: Yes Admission Status: Involuntary Referral Source: Other ([employer]) Insurance type: Self-pay     Crisis Care Plan Living Arrangements: Alone Legal Guardian: Other: (Pt is unsure. ) Name of Psychiatrist: Merlyn Albert. Name of Therapist: NA  Education  Status Is patient currently in school?: No Current Grade: NA Highest grade of school patient has completed: Pt reported, smoke college. Name of school: NA Contact person: NA  Risk to self with the past 6 months Suicidal Ideation: Yes-Currently Present (Per IVC however, pt denies. ) Has  patient been a risk to self within the past 6 months prior to admission? : Yes (Per chart.) Suicidal Intent: No Has patient had any suicidal intent within the past 6 months prior to admission? : Yes (Per chart. ) Is patient at risk for suicide?: No Suicidal Plan?: No Has patient had any suicidal plan within the past 6 months prior to admission? : Yes (Per chart.) Specify Current Suicidal Plan: NA Access to Means: No What has been your use of drugs/alcohol within the last 12 months?: Cigarettes. Previous Attempts/Gestures: No (Pt denies. ) How many times?: 0 Other Self Harm Risks: Pt denies.  Triggers for Past Attempts: None known Intentional Self Injurious Behavior: None (Pt denies. ) Family Suicide History: Unable to assess Recent stressful life event(s): Other (Comment) (Pt reported, "proud and sad about who I am.") Persecutory voices/beliefs?: No Depression: Yes Depression Symptoms: Feeling worthless/self pity, Loss of interest in usual pleasures Substance abuse history and/or treatment for substance abuse?: No Suicide prevention information given to non-admitted patients: Not applicable  Risk to Others within the past 6 months Homicidal Ideation: Yes-Currently Present (Per IVC, however pt denies. ) Does patient have any lifetime risk of violence toward others beyond the six months prior to admission? : Unknown Thoughts of Harm to Others: Yes-Currently Present (Per IVC, however pt denies. ) Comment - Thoughts of Harm to Others: Per IVC pt called, his sister and "I'm going to kill you all on her voicemail." Current Homicidal Intent: Yes-Currently Present Current Homicidal Plan: No Access to Homicidal Means: No (Pt denies.) Identified Victim: Pt left a threathening message on his sisters voicemail.  History of harm to others?: No (Pt denies. ) Assessment of Violence: None Noted Violent Behavior Description: NA Does patient have access to weapons?: No (Pt denies.) Criminal Charges  Pending?: No Does patient have a court date: No Is patient on probation?: No  Psychosis Hallucinations: Auditory, Visual Delusions: Unspecified  Mental Status Report Appearance/Hygiene: In scrubs Eye Contact: Fair Motor Activity: Freedom of movement Speech: Soft, Slow Level of Consciousness: Quiet/awake Mood: Preoccupied Affect: Flat Anxiety Level: None Thought Processes: Thought Blocking Judgement: Partial Orientation: Other (Comment) (day, year, city and state.) Obsessive Compulsive Thoughts/Behaviors: None  Cognitive Functioning Concentration: Fair Memory: Recent Intact IQ: Average Insight: Poor Impulse Control: Poor Appetite: Good Weight Loss: 0 Weight Gain: 0 Sleep: Decreased Total Hours of Sleep: 6 Vegetative Symptoms: None  ADLScreening [facility] Assessment Services) Patient's cognitive ability adequate to safely complete daily activities?: Yes Patient able to express need for assistance with ADLs?: Yes Independently performs ADLs?: Yes (appropriate for developmental age)  Prior Inpatient Therapy Prior Inpatient Therapy: Yes Prior Therapy Dates: 2018 Prior Therapy Facilty/Provider(s): [facility] Reason for Treatment: SI, Psychosis  Prior Outpatient Therapy Prior Outpatient Therapy: Yes Prior Therapy Dates: Ongoing Prior Therapy Facilty/Provider(s): [facility] Reason for Treatment: Medication management.  Does patient have an ACCT team?: No Does patient have Intensive In-House Services?  : No Does patient have [facility] services? : No Does patient have P4CC services?: No  ADL Screening (condition at time of admission) Patient's cognitive ability adequate to safely complete daily activities?: Yes Is the patient deaf or have difficulty hearing?: No Does the patient have difficulty seeing, even when wearing glasses/contacts?:  No Does the patient have difficulty concentrating, remembering, or making decisions?: Yes Patient able to express need for assistance with ADLs?:  Yes Does the patient have difficulty dressing or bathing?: No Independently performs ADLs?: Yes (appropriate for developmental age) Does the patient have difficulty walking or climbing stairs?: No Weakness of Legs: None Weakness of Arms/Hands: None       Abuse/Neglect Assessment (Assessment to be complete while patient is alone) Physical Abuse: Denies (Pt denies.) Verbal Abuse: Denies (Pt denies.) Sexual Abuse: Denies (Pt denies.) Exploitation of patient/patient's resources: Denies (Pt denies. ) Self-Neglect: Denies (Pt denies. )     Advance Directives (For Healthcare) Does Patient Have a Medical Advance Directive?: No    Additional Information 1:1 In Past 12 Months?: No CIRT Risk: Yes Elopement Risk: No Does patient have medical clearance?: Yes     Disposition:  Nira ConnJason Berry, NP recommends inpatient treatment. Disposition discussed with Mercedes, PA and [place], NT. TTS to seek placement.   Disposition Initial Assessment Completed for this Encounter: Yes Disposition of Patient: Inpatient treatment program Type of inpatient treatment program: Adult  Redmond Pullingreylese D Telly Broberg 10/24/2016 9:18 PM   Redmond Pullingreylese D Nakya Weyand, MS, [facility], [facility] Triage Specialist [phone]

## 2016-10-24 NOTE — ED Notes (Signed)
Bed: ZO10WA26 Expected date:  Expected time:  Means of arrival:  Comments: [employer]-voluntary medical clearance

## 2016-10-24 NOTE — ED Triage Notes (Signed)
Patient brought in by [employer] from home for psychiatric eval. Reports mom is taking out IVC paperwork. Aggressive behavior towards mom. Patient denies SI/HI. States that he just needs someone to talk to. "When I dont have anyone to talk to I go crazy".

## 2016-10-25 ENCOUNTER — Inpatient Hospital Stay (HOSPITAL_COMMUNITY)
Admission: AD | Admit: 2016-10-25 | Discharge: 2016-10-31 | DRG: 885 | Disposition: A | Discharge status: Home / Self Care | Payer: Federal, State, Local not specified - Other | Attending: Psychiatry | Admitting: Psychiatry

## 2016-10-25 ENCOUNTER — Encounter (HOSPITAL_COMMUNITY): Payer: Self-pay

## 2016-10-25 DIAGNOSIS — G4700 Insomnia, unspecified: Secondary | ICD-10-CM

## 2016-10-25 DIAGNOSIS — R45851 Suicidal ideations: Secondary | ICD-10-CM

## 2016-10-25 DIAGNOSIS — F200 Paranoid schizophrenia: Secondary | ICD-10-CM | POA: Diagnosis not present

## 2016-10-25 DIAGNOSIS — F329 Major depressive disorder, single episode, unspecified: Secondary | ICD-10-CM | POA: Diagnosis present

## 2016-10-25 DIAGNOSIS — F17210 Nicotine dependence, cigarettes, uncomplicated: Secondary | ICD-10-CM | POA: Diagnosis not present

## 2016-10-25 DIAGNOSIS — F209 Schizophrenia, unspecified: Secondary | ICD-10-CM | POA: Diagnosis present

## 2016-10-25 DIAGNOSIS — F39 Unspecified mood [affective] disorder: Secondary | ICD-10-CM | POA: Diagnosis not present

## 2016-10-25 DIAGNOSIS — R45850 Homicidal ideations: Secondary | ICD-10-CM

## 2016-10-25 DIAGNOSIS — R443 Hallucinations, unspecified: Secondary | ICD-10-CM

## 2016-10-25 MED ORDER — ONDANSETRON HCL 4 MG PO TABS
4.0000 mg | ORAL_TABLET | Freq: Three times a day (TID) | ORAL | Status: DC | PRN
Start: 2016-10-25 — End: 2016-10-31

## 2016-10-25 MED ORDER — TRAZODONE HCL 100 MG PO TABS
100.0000 mg | ORAL_TABLET | Freq: Every day | ORAL | Status: DC
  Administered 2016-10-26 – 2016-10-30 (×5): 100 mg via ORAL
  Filled 2016-10-25 (×3): qty 1
  Filled 2016-10-25: qty 7
  Filled 2016-10-25 (×5): qty 1

## 2016-10-25 MED ORDER — NICOTINE 21 MG/24HR TD PT24
21.0000 mg | MEDICATED_PATCH | Freq: Every day | TRANSDERMAL | Status: DC
  Filled 2016-10-25 (×8): qty 1

## 2016-10-25 MED ORDER — ACETAMINOPHEN 325 MG PO TABS: 650.0000 mg | ORAL_TABLET | ORAL | Status: DC | PRN

## 2016-10-25 MED ORDER — DIVALPROEX SODIUM 500 MG PO DR TAB
500.0000 mg | DELAYED_RELEASE_TABLET | Freq: Two times a day (BID) | ORAL | Status: DC
  Administered 2016-10-25 – 2016-10-31 (×12): 500 mg via ORAL
  Filled 2016-10-25 (×14): qty 1
  Filled 2016-10-25 (×2): qty 14
  Filled 2016-10-25 (×2): qty 1

## 2016-10-25 MED ORDER — ALUM & MAG HYDROXIDE-SIMETH 200-200-20 MG/5ML PO SUSP
30.0000 mL | Freq: Four times a day (QID) | ORAL | Status: DC | PRN
Start: 2016-10-25 — End: 2016-10-31

## 2016-10-25 MED ORDER — MAGNESIUM HYDROXIDE 400 MG/5ML PO SUSP: 30.0000 mL | Freq: Every day | ORAL | Status: DC | PRN

## 2016-10-25 MED ORDER — RISPERIDONE 1 MG PO TABS
1.0000 mg | ORAL_TABLET | Freq: Two times a day (BID) | ORAL | Status: DC
  Administered 2016-10-25 – 2016-10-29 (×8): 1 mg via ORAL
  Filled 2016-10-25 (×12): qty 1

## 2016-10-25 NOTE — BH Assessment (Signed)
[facility] Assessment Progress Note  Per Thedore MinsMojeed Akintayo, MD, this pt requires psychiatric hospitalization.  Paul LimesLinsey Strader, RN, [facility]; they will be ready to receive pt at 14:00.  Pt presents under IVC initiated by pt's mother, and upheld by Dr Jannifer FranklinAkintayo, and [facility].  Pt's nurse, Paul Mendez, has been notified, and agrees to call report to [phone].  Pt is to be transported via [employer].   Paul Canninghomas Stephany Poorman, MA Triage Specialist [phone]

## 2016-10-25 NOTE — ED Notes (Signed)
Patient denies pain and is resting comfortably.  

## 2016-10-25 NOTE — Consult Note (Signed)
[place] Psychiatry Consult   Reason for Consult:  Psychiatric evaluation Referring Physician:  EDP Patient Identification: Paul Mendez MRN:  [id number] Principal Diagnosis: Schizophrenia, paranoid type [facility]) Diagnosis:   Patient Active Problem List   Diagnosis Date Noted  . Schizophrenia, paranoid type ([place]) [F20.0] 10/20/2016    Priority: High    Total Time spent with patient: 45 minutes  Subjective:   Paul Mendez is a 31 y.o. male patient admitted with paranoia and suicidal thoughts.  HPI:  Patient with history of paranoid schizophrenia who was IVC'd by his mother due to non-complaint with medication, psychosis or delusional thinking. Patient presents with paranoid delusions, he reports that he went to his mother's home yesterday because he believed someone was trying to hurt her, he got aggressive in an attempt to try to get inside his mother's home. Patient reports suicidal thoughts and has homicidal thoughts towards his family. Patient is non-compliant with his medications, he denies drugs and alcohol abuse.  Past Psychiatric History: as above  Risk to Self: Suicidal Ideation: Yes-Currently Present (Per IVC however, pt denies. ) Suicidal Intent: No Is patient at risk for suicide?: No Suicidal Plan?: No Specify Current Suicidal Plan: NA Access to Means: No What has been your use of drugs/alcohol within the last 12 months?: Cigarettes. How many times?: 0 Other Self Harm Risks: Pt denies.  Triggers for Past Attempts: None known Intentional Self Injurious Behavior: None (Pt denies. ) Risk to Others: Homicidal Ideation: Yes-Currently Present (Per IVC, however pt denies. ) Thoughts of Harm to Others: Yes-Currently Present (Per IVC, however pt denies. ) Comment - Thoughts of Harm to Others: Per IVC pt called, his sister and "I'm going to kill you all on her voicemail." Current Homicidal Intent: Yes-Currently Present Current Homicidal Plan: No Access to Homicidal Means: No  (Pt denies.) Identified Victim: Pt left a threathening message on his sisters voicemail.  History of harm to others?: No (Pt denies. ) Assessment of Violence: None Noted Violent Behavior Description: NA Does patient have access to weapons?: No (Pt denies.) Criminal Charges Pending?: No Does patient have a court date: No Prior Inpatient Therapy: Prior Inpatient Therapy: Yes Prior Therapy Dates: 2018 Prior Therapy Facilty/Provider(s): [facility] Reason for Treatment: SI, Psychosis Prior Outpatient Therapy: Prior Outpatient Therapy: Yes Prior Therapy Dates: Ongoing Prior Therapy Facilty/Provider(s): [facility] Reason for Treatment: Medication management.  Does patient have an ACCT team?: No Does patient have Intensive In-House Services?  : No Does patient have [facility] services? : No Does patient have P4CC services?: No  Past Medical History:  Past Medical History:  Diagnosis Date  . Genital warts   . Schizophrenia ([place])    History reviewed. No pertinent surgical history. Family History: No family history on file. Family Psychiatric  History:  Social History:  History  Alcohol Use  . Yes    Comment: occ     History  Drug Use No    Social History   Social History  . Marital status: Single    Spouse name: N/A  . Number of children: N/A  . Years of education: N/A   Social History Main Topics  . Smoking status: Current Some Day Smoker    Types: Cigarettes  . Smokeless tobacco: Never Used  . Alcohol use Yes     Comment: occ  . Drug use: No  . Sexual activity: Not Asked   Other Topics Concern  . None   Social History Narrative  . None   Additional Social History:  Allergies:  No Known Allergies  Labs:  Results for orders placed or performed during the hospital encounter of 10/24/16 (from the past 48 hour(s))  Urine rapid drug screen (hosp performed)     Status: None   Collection Time: 10/24/16  5:59 PM  Result Value Ref Range   Opiates NONE DETECTED NONE DETECTED    Cocaine NONE DETECTED NONE DETECTED   Benzodiazepines NONE DETECTED NONE DETECTED   Amphetamines NONE DETECTED NONE DETECTED   Tetrahydrocannabinol NONE DETECTED NONE DETECTED   Barbiturates NONE DETECTED NONE DETECTED    Comment:        DRUG SCREEN FOR MEDICAL PURPOSES ONLY.  IF CONFIRMATION IS NEEDED FOR ANY PURPOSE, NOTIFY LAB WITHIN 5 DAYS.        LOWEST DETECTABLE LIMITS FOR URINE DRUG SCREEN Drug Class       Cutoff (ng/mL) Amphetamine      1000 Barbiturate      200 Benzodiazepine   428 Tricyclics       768 Opiates          300 Cocaine          300 THC              50   CBC w/diff     Status: None   Collection Time: 10/24/16  6:24 PM  Result Value Ref Range   WBC 6.4 4.0 - 10.5 K/uL   RBC 4.39 4.22 - 5.81 MIL/uL   Hemoglobin 14.2 13.0 - 17.0 g/dL   HCT 40.0 39.0 - 52.0 %   MCV 91.1 78.0 - 100.0 fL   MCH 32.3 26.0 - 34.0 pg   MCHC 35.5 30.0 - 36.0 g/dL   RDW 12.2 11.5 - 15.5 %   Platelets 296 150 - 400 K/uL   Neutrophils Relative % 53 %   Neutro Abs 3.4 1.7 - 7.7 K/uL   Lymphocytes Relative 31 %   Lymphs Abs 2.0 0.7 - 4.0 K/uL   Monocytes Relative 14 %   Monocytes Absolute 0.9 0.1 - 1.0 K/uL   Eosinophils Relative 1 %   Eosinophils Absolute 0.1 0.0 - 0.7 K/uL   Basophils Relative 1 %   Basophils Absolute 0.0 0.0 - 0.1 K/uL  Comprehensive metabolic panel     Status: Abnormal   Collection Time: 10/24/16  6:24 PM  Result Value Ref Range   Sodium 139 135 - 145 mmol/L   Potassium 4.0 3.5 - 5.1 mmol/L   Chloride 102 101 - 111 mmol/L   CO2 28 22 - 32 mmol/L   Glucose, Bld 103 (H) 65 - 99 mg/dL   BUN 18 6 - 20 mg/dL   Creatinine, Ser 1.25 (H) 0.61 - 1.24 mg/dL   Calcium 9.6 8.9 - 10.3 mg/dL   Total Protein 7.7 6.5 - 8.1 g/dL   Albumin 4.7 3.5 - 5.0 g/dL   AST 18 15 - 41 U/L   ALT 15 (L) 17 - 63 U/L   Alkaline Phosphatase 53 38 - 126 U/L   Total Bilirubin 0.8 0.3 - 1.2 mg/dL   GFR calc non Af Amer >60 >60 mL/min   GFR calc Af Amer >60 >60 mL/min    Comment:  (NOTE) The eGFR has been calculated using the CKD EPI equation. This calculation has not been validated in all clinical situations. eGFR's persistently <60 mL/min signify possible Chronic Kidney Disease.    Anion gap 9 5 - 15  Ethanol     Status: None   Collection Time:  10/24/16  6:24 PM  Result Value Ref Range   Alcohol, Ethyl (B) <5 <5 mg/dL    Comment:        LOWEST DETECTABLE LIMIT FOR SERUM ALCOHOL IS 5 mg/dL FOR MEDICAL PURPOSES ONLY   Salicylate level     Status: None   Collection Time: 10/24/16  6:24 PM  Result Value Ref Range   Salicylate Lvl <1.6 2.8 - 30.0 mg/dL  Acetaminophen level     Status: Abnormal   Collection Time: 10/24/16  6:24 PM  Result Value Ref Range   Acetaminophen (Tylenol), Serum <10 (L) 10 - 30 ug/mL    Comment:        THERAPEUTIC CONCENTRATIONS VARY SIGNIFICANTLY. A RANGE OF 10-30 ug/mL MAY BE AN EFFECTIVE CONCENTRATION FOR MANY PATIENTS. HOWEVER, SOME ARE BEST TREATED AT CONCENTRATIONS OUTSIDE THIS RANGE. ACETAMINOPHEN CONCENTRATIONS >150 ug/mL AT 4 HOURS AFTER INGESTION AND >50 ug/mL AT 12 HOURS AFTER INGESTION ARE OFTEN ASSOCIATED WITH TOXIC REACTIONS.     Current Facility-Administered Medications  Medication Dose Route Frequency Provider Last Rate Last Dose  . acetaminophen (TYLENOL) tablet 650 mg  650 mg Oral Q4H PRN Street, [place], [place]      . alum & mag hydroxide-simeth (MAALOX/MYLANTA) 200-200-20 MG/5ML suspension 30 mL  30 mL Oral Q6H PRN Street, [place], [place]      . divalproex (DEPAKOTE) DR tablet 500 mg  500 mg Oral BID Street, [place], [place]   500 mg at 10/25/16 0941  . nicotine (NICODERM CQ - dosed in mg/24 hours) patch 21 mg  21 mg Transdermal Daily Street, [place], PA-C      . ondansetron [facility]) tablet 4 mg  4 mg Oral Q8H PRN Street, [place], [place]      . risperiDONE (RISPERDAL) tablet 1 mg  1 mg Oral BID Street, [place], [place]   1 mg at 10/25/16 0941  . traZODone (DESYREL) tablet 100 mg  100 mg Oral QHS Street,  [place], [place]   100 mg at 10/24/16 2105  . zolpidem (AMBIEN) tablet 5 mg  5 mg Oral QHS PRN Street, [place], [place]       Current Outpatient Prescriptions  Medication Sig Dispense Refill  . divalproex (DEPAKOTE) 500 MG DR tablet Take 1 tablet (500 mg total) by mouth 2 (two) times daily. (Patient not taking: Reported on 10/24/2016) 60 tablet 0  . risperiDONE (RISPERDAL) 1 MG tablet Take 1 tablet (1 mg total) by mouth 2 (two) times daily. (Patient not taking: Reported on 10/24/2016) 60 tablet 0  . traZODone (DESYREL) 100 MG tablet Take 1 tablet (100 mg total) by mouth at bedtime. (Patient not taking: Reported on 10/24/2016) 30 tablet 0    Musculoskeletal: Strength & Muscle Tone: within normal limits Gait & Station: normal Patient leans: N/A  Psychiatric Specialty Exam: Physical Exam  Psychiatric: His mood appears anxious. His speech is delayed. He is agitated and actively hallucinating. Thought content is paranoid and delusional. Cognition and memory are normal. He expresses impulsivity. He expresses homicidal and suicidal ideation.    Review of Systems  Constitutional: Negative.   HENT: Negative.   Eyes: Negative.   Respiratory: Negative.   Cardiovascular: Negative.   Gastrointestinal: Negative.   Genitourinary: Negative.   Musculoskeletal: Negative.   Skin: Negative.   Neurological: Negative.   Endo/Heme/Allergies: Negative.   Psychiatric/Behavioral: Positive for hallucinations and suicidal ideas. The patient is nervous/anxious and has insomnia.     Blood pressure 110/66, pulse 64, temperature 98 F (36.7 C), resp. rate 18, SpO2 100 %.There  is no height or weight on file to calculate BMI.  General Appearance: Casual  Eye Contact:  Minimal  Speech:  Pressured  Volume:  Increased  Mood:  Irritable  Affect:  Constricted  Thought Process:  Disorganized  Orientation:  Full (Time, Place, and Person)  Thought Content:  Illogical, Delusions and Hallucinations: Auditory  Suicidal  Thoughts:  Yes.  without intent/plan  Homicidal Thoughts:  Yes.  without intent/plan  Memory:  Immediate;   Fair Recent;   Fair Remote;   Fair  Judgement:  Poor  Insight:  Lacking  Psychomotor Activity:  Increased and Restlessness  Concentration:  Concentration: Fair and Attention Span: Fair  Recall:  [employer] of Knowledge:  Fair  Language:  Fair  Akathisia:  No  Handed:  Right  AIMS (if indicated):     Assets:  Communication Skills  ADL's:  Intact  Cognition:  WNL  Sleep:   poor     Treatment Plan Summary: Daily contact with patient to assess and evaluate symptoms and progress in treatment and Medication management Re-start home medications. Risperdal 1 mg bid for psychosis and Depakote 500 mg bid for mood stabilization.  Disposition: Recommend psychiatric Inpatient admission when medically cleared.  Corena Pilgrim, MD 10/25/2016 10:43 AM

## 2016-10-25 NOTE — Tx Team (Signed)
Initial Treatment Plan 10/25/2016 7:53 PM Paul Mendez [id number]    PATIENT STRESSORS: Financial difficulties Medication change or noncompliance   PATIENT STRENGTHS: Communication skills Physical Health Supportive family/friends   PATIENT IDENTIFIED PROBLEMS: Psychosis  Verbally/physically aggressive towards family  "Get social security"  "Go back home and be functional"               DISCHARGE CRITERIA:  Improved stabilization in mood, thinking, and/or behavior Need for constant or close observation no longer present Verbal commitment to aftercare and medication compliance  PRELIMINARY DISCHARGE PLAN: Outpatient therapy Medication management  PATIENT/FAMILY INVOLVEMENT: This treatment plan has been presented to and reviewed with the patient, Paul Mendez.  The patient and family have been given the opportunity to ask questions and make suggestions.  Levin BaconHeather V Ishmeal Rorie, RN 10/25/2016, 7:53 PM

## 2016-10-25 NOTE — Progress Notes (Signed)
Jenetta DownerBrian Vessey is a 31 year old male being admitted involuntarily to [facility]-2 from [facility].  He came to the ED under IVC for hearing and seeing people get murdered.  His mother IVC'd him for refusing to take his medication for his bipolar disorder and schizophrenia.  She reported that he isn't eating or sleeping.  He also left a voice mail on his sister's phone stating he was going to "kill you all."  He also showed up at his mother's house trying to barge in and police were called.  He has history of psychiatric IP admission.  He is diagnosed with Schizophrenia.  [facility].  He was pleasant but guarded.  He denies any SI/HI or A/V hallucinations only stating "I feel better today."  He denies any pain or discomfort and appears to be in no physical distress.  Oriented him to the unit.  Admission paperwork completed and signed.  Belongings searched and secured in locker # 40.  Skin assessment completed and no skin issues noted.  Q 15 minute checks initiated for safety.  We will monitor the progress towards his goals.

## 2016-10-25 NOTE — Progress Notes (Signed)
  DATA ACTION RESPONSE  Objective- Pt. is visible in the dayroom, seen interacting with peers.  Presents with a depressed/flat affect and mood. Mildly brightens on approach.No further c/o. No abnormal s/s.  Subjective- Denies having any SI/HI/AVH/Pain at this time.Pt. states " I don't really know what's wrong with me; I just feel lonely". Is cooperative and remain safe on the unit.  1:1 interaction in private to establish rapport. Encouragement, education, & support given from staff.  Safety maintained with Q 15 checks. Continue with POC.

## 2016-10-25 NOTE — ED Notes (Signed)
Pt A/O, Denies SI/HI/AVH. Pt slept well. Tolerated breakfast. He noted that he wants to speak with doctor about living arrangements.

## 2016-10-25 NOTE — Plan of Care (Signed)
Problem: Activity: Goal: Interest or engagement in activities will improve Outcome: Progressing Pt. attended wrap-up group this evening.    

## 2016-10-25 NOTE — Progress Notes (Signed)
Adult Psychoeducational Group Note  Date:  10/25/2016 Time:  8:52 PM  Group Topic/Focus:  Wrap-Up Group:   The focus of this group is to help patients review their daily goal of treatment and discuss progress on daily workbooks.  Participation Level:  Active  Participation Quality:  Attentive  Affect:  Appropriate  Cognitive:  Appropriate  Insight: Appropriate  Engagement in Group:  Engaged  Modes of Intervention:  Discussion  Additional Comments: The patient expressed that he rates today a 8.The patient also said that [facility]  Octavio Mannshigpen, Christiona Siddique Lee 10/25/2016, 8:52 PM

## 2016-10-25 NOTE — Progress Notes (Signed)
10/25/16 1318:  LRT went to pt room to offer activities, pt was sleep.  Caroll RancherMarjette Janaa Acero, LRT/CTRS

## 2016-10-26 DIAGNOSIS — G4700 Insomnia, unspecified: Secondary | ICD-10-CM

## 2016-10-26 DIAGNOSIS — F17210 Nicotine dependence, cigarettes, uncomplicated: Secondary | ICD-10-CM

## 2016-10-26 DIAGNOSIS — F200 Paranoid schizophrenia: Secondary | ICD-10-CM

## 2016-10-26 DIAGNOSIS — R443 Hallucinations, unspecified: Secondary | ICD-10-CM

## 2016-10-26 NOTE — [facility] Group Notes (Signed)
Nursing Psychoeducational group - Coping skills. Patient attended and participated appropriately.  

## 2016-10-26 NOTE — [facility] Group Notes (Signed)
[facility] Group Notes:  (Clinical Social Work)  10/26/2016  11:15-12:00PM  Summary of Progress/Problems:   Today's process group involved patients discussing their feelings related to being hospitalized, as well as benefits they see to being in the hospital.  The group brainstormed specific ways in which they could seek for those same benefits to happen when they discharge and go back home. The patient expressed a primary feeling about being hospitalized is that even though he did not like it at first, he feels it is helping him to an extent that he will be able to get a job when he gets out of the hospital.  Type of Therapy:  Group Therapy - Process  Participation Level:  Active  Participation Quality:  Attentive and Sharing  Affect:  Not Congruent  Cognitive:  Appropriate  Insight:  Developing/Improving  Engagement in Therapy:  Developing/Improving  Modes of Intervention:  Exploration, Discussion  Ambrose MantleMareida Grossman-Orr, LCSW 10/26/2016, 1:02 PM

## 2016-10-26 NOTE — Progress Notes (Signed)
D: Pt A & O to self, time and place. Denies SI, HI, AVH and pain "not right now". Pt observed with inappropriate laughter / giggles, looking around him during groups as if responding to internal stimuli. Rates his depression 0/10, hopelessness 5/10 and anxiety 5/10, pt stated "I just get anxious being in and out of the hospital; that's all". Reports he's eating and sleeping well with normal energy and good concentration level on self inventory sheet.  A: Medications administered as ordered and effects monitored. EKG done this shift. Support and availability provided to pt. Encouraged pt to voice concerns, attend unit groups and scheduled activities. Q 15 minutes safety checks maintained without outburst or self harm gestures at this time.  R: Pt attended scheduled groups when prompted. Tolerated EKG and medications well, denies adverse drug reaction thus far. Remains safe on and off unit. POC continues.

## 2016-10-26 NOTE — [facility] Suicide Risk Assessment (Signed)
[facility] Admission Suicide Risk Assessment   Nursing information obtained from:  Patient Demographic factors:  Male Current Mental Status:  NA Loss Factors:  Financial problems / change in socioeconomic status Historical Factors:  NA Risk Reduction Factors:  NA  Total Time spent with patient: 20 minutes Principal Problem: Schizophrenia, paranoid type (HCC) Diagnosis:   Patient Active Problem List   Diagnosis Date Noted  . Schizophrenia, paranoid type (HCC) [F20.0] 10/20/2016   Subjective Data:  Paul Mendez is a 31 year old male with schizophrenia, bipolar disorder, who was petitioned by his mother for danger to others in the setting of non adherence to medication.   Please see H&P for more details.  He states that he is here, as "everybody thinks I need help." He cannot elaborate it in details. When he is asked about his HI, he states that he did not mean to hurt anybody,l but he thought "somebody might kill him." He could not elaborate it, but states that "him" is somebody. He denies SI. He is hoping to find a job. He denies feeling depressed or anxiety. He had some AH of "spiritual (voice)" in the past, but not recently. He denies VH.   Continued Clinical Symptoms:  Alcohol Use Disorder Identification Test Final Score (AUDIT): 0 The "Alcohol Use Disorders Identification Test", Guidelines for Use in Primary Care, Second Edition.  World Science writer [facility]). Score between 0-7:  no or low risk or alcohol related problems. Score between 8-15:  moderate risk of alcohol related problems. Score between 16-19:  high risk of alcohol related problems. Score 20 or above:  warrants further diagnostic evaluation for alcohol dependence and treatment.   CLINICAL FACTORS:   Schizophrenia:   Paranoid or undifferentiated type   Musculoskeletal: Strength & Muscle Tone: within normal limits Gait & Station: normal Patient leans: N/A  Psychiatric Specialty Exam: Physical Exam  Nursing note and  vitals reviewed. Constitutional: He is oriented to person, place, and time.  Neurological: He is alert and oriented to person, place, and time.    Review of Systems  Psychiatric/Behavioral: Positive for hallucinations. Negative for depression, substance abuse and suicidal ideas. The patient is not nervous/anxious and does not have insomnia.   All other systems reviewed and are negative.   Blood pressure 116/79, pulse (!) 106, temperature 98.6 F (37 C), temperature source Oral, resp. rate 16, height 6\' 2"  (1.88 m), weight 196 lb (88.9 kg), SpO2 100 %.Body mass index is 25.16 kg/m.  General Appearance: Fairly Groomed  Eye Contact:  Good, decreased blank  Speech:  Clear and Coherent  Volume:  Normal  Mood:  "fine"  Affect:  Blunt  Thought Process:  Disorganized, occasional thought blocking  Orientation:  Full (Time, Place, and Person)  Thought Content:  Delusions Perceptions: denies AH/VH  Suicidal Thoughts:  No  Homicidal Thoughts:  No  Memory:  Immediate;   Fair Recent;   Fair Remote;   Fair  Judgement:  Impaired  Insight:  Lacking  Psychomotor Activity:  Normal  Concentration:  Concentration: Fair and Attention Span: Fair  Recall:  Good  Fund of Knowledge:  Good  Language:  Good  Akathisia:  No  Handed:  Right  AIMS (if indicated):   no tremors, no rigidity  Assets:  Manufacturing systems engineer Physical Health  ADL's:  Intact  Cognition:  WNL  Sleep:   good    COGNITIVE FEATURES THAT CONTRIBUTE TO RISK:  Polarized thinking    SUICIDE RISK:   Minimal: No identifiable suicidal ideation.  Patients presenting with no risk factors but with morbid ruminations; may be classified as minimal risk based on the severity of the depressive symptoms  PLAN OF CARE:  Patient will be admitted to inpatient psychiatric unit for stabilization and safety. Will provide and encourage milieu participation. Provide medication management and maked adjustments as needed.  Will follow daily.   I  certify that inpatient services furnished can reasonably be expected to improve the patient's condition.   Neysa Hottereina Justa Hatchell, MD 10/26/2016, 11:31 AM

## 2016-10-26 NOTE — H&P (Signed)
Psychiatric Admission Assessment Adult  Patient Identification: Paul Mendez MRN:  [id number] Date of Evaluation:  10/26/2016 Chief Complaint:  SCHIZOPHRENIA Principal Diagnosis: Schizophrenia, paranoid type ([place]) Diagnosis:   Patient Active Problem List   Diagnosis Date Noted  . Schizophrenia, paranoid type ([place]) [F20.0] 10/20/2016   History of Present Illness:Per tele assessment- Paul Mendez is an 31 y.o. male, who presents involuntarily and unaccompanied to [facility]. Clinician asked the pt, "What brought you to the hospital?" Pt responded, "I just needed to be around some people." Pt denied SI, HI, self-injurious behaviors and access to weapons. Clinician asked if the pt had experienced any hallucinations or delusions?  Pt replied,"I think I do, I see people get murdered. I just heard somebody get murdered."  Pt reported, "I was trying to be successful. I don't know if I'm having a vision." Pt reported, hearing the act of someone getting murdered.  Pt was IVC'd by his mother: "Respondent has been diagnosed with bipolar and schizophrenia. He refuses to taking his medication as prescribed by physical. He isn't eating or sleep[ing. The respondent was reviewed by mobile crisis and they suggested he seek further treatment. Respondent is having suicidal thoughts. He has left voicemail on his sisters phone stating "I'm going to kill you all." Respondent hears voices and believes that he is talking to someone that is not there. Today the respondent showed up to his mother's house trying to barge in, even when the police was present Family is afraid of the respondent."   Pt denied abuse. Pt reported, smoking a pack of cigarettes daily. Pt's UDS and BAL are negative. Pt reported, he received medication management with Josph Macho. Pt reported, taking his medication as prescribed. Pt reported, previous inpatient admissions.   Pt presents quiet/awae in scrubs with soft/sloe speech. Pt's eye contact was fair. Pt's mood  was preoccupied. Pt's affect was flat. Pt's thought process was thought blocking. Pt's judgement was partial. Pt's concentration was fair. Pt's insight and impulse control are poor. Pt was oriented x4 (day, year, city and state.) Pt reported, if discharged from [facility] he could contract for safety.  Associated Signs/Symptoms: Depression Symptoms:  depressed mood, insomnia, hopelessness, (Hypo) Manic Symptoms:  Delusions, Distractibility, Hallucinations, Impulsivity, Anxiety Symptoms:  Excessive Worry, Paronoia Psychotic Symptoms:  Delusions, Paranoia, PTSD Symptoms: Hypervigilance:  Yes Total Time spent with patient: 30 minutes  Past Psychiatric History:   Is the patient at risk to self? Yes.    Has the patient been a risk to self in the past 6 months? Yes.    Has the patient been a risk to self within the distant past? Yes.    Is the patient a risk to others? Yes.    Has the patient been a risk to others in the past 6 months? Yes.    Has the patient been a risk to others within the distant past? Yes.     Prior Inpatient Therapy:   Prior Outpatient Therapy:    Alcohol Screening: 1. How often do you have a drink containing alcohol?: Never 9. Have you or someone else been injured as a result of your drinking?: No 10. Has a relative or friend or a doctor or another health worker been concerned about your drinking or suggested you cut down?: No Alcohol Use Disorder Identification Test Final Score (AUDIT): 0 Brief Intervention: AUDIT score less than 7 or less-screening does not suggest unhealthy drinking-brief intervention not indicated Substance Abuse History in the last 12 months:  No. Consequences of Substance Abuse:  NA Previous Psychotropic Medications: YES Psychological Evaluations: YES Past Medical History:  Past Medical History:  Diagnosis Date  . Genital warts   . Schizophrenia ([place])    History reviewed. No pertinent surgical history. Family History: History reviewed. No  pertinent family history. Family Psychiatric  History: Tobacco Screening: Have you used any form of tobacco in the last 30 days? (Cigarettes, Smokeless Tobacco, Cigars, and/or Pipes): Yes Tobacco use, Select all that apply: 5 or more cigarettes per day Are you interested in Tobacco Cessation Medications?: Yes, will notify MD for an order Counseled patient on smoking cessation including recognizing danger situations, developing coping skills and basic information about quitting provided: Refused/Declined practical counseling Social History:  History  Alcohol Use  . Yes    Comment: occ     History  Drug Use No    Additional Social History:                           Allergies:  No Known Allergies Lab Results:  Results for orders placed or performed during the hospital encounter of 10/24/16 (from the past 48 hour(s))  Urine rapid drug screen (hosp performed)     Status: None   Collection Time: 10/24/16  5:59 PM  Result Value Ref Range   Opiates NONE DETECTED NONE DETECTED   Cocaine NONE DETECTED NONE DETECTED   Benzodiazepines NONE DETECTED NONE DETECTED   Amphetamines NONE DETECTED NONE DETECTED   Tetrahydrocannabinol NONE DETECTED NONE DETECTED   Barbiturates NONE DETECTED NONE DETECTED    Comment:        DRUG SCREEN FOR MEDICAL PURPOSES ONLY.  IF CONFIRMATION IS NEEDED FOR ANY PURPOSE, NOTIFY LAB WITHIN 5 DAYS.        LOWEST DETECTABLE LIMITS FOR URINE DRUG SCREEN Drug Class       Cutoff (ng/mL) Amphetamine      1000 Barbiturate      200 Benzodiazepine   326 Tricyclics       712 Opiates          300 Cocaine          300 THC              50   CBC w/diff     Status: None   Collection Time: 10/24/16  6:24 PM  Result Value Ref Range   WBC 6.4 4.0 - 10.5 K/uL   RBC 4.39 4.22 - 5.81 MIL/uL   Hemoglobin 14.2 13.0 - 17.0 g/dL   HCT 40.0 39.0 - 52.0 %   MCV 91.1 78.0 - 100.0 fL   MCH 32.3 26.0 - 34.0 pg   MCHC 35.5 30.0 - 36.0 g/dL   RDW 12.2 11.5 - 15.5 %    Platelets 296 150 - 400 K/uL   Neutrophils Relative % 53 %   Neutro Abs 3.4 1.7 - 7.7 K/uL   Lymphocytes Relative 31 %   Lymphs Abs 2.0 0.7 - 4.0 K/uL   Monocytes Relative 14 %   Monocytes Absolute 0.9 0.1 - 1.0 K/uL   Eosinophils Relative 1 %   Eosinophils Absolute 0.1 0.0 - 0.7 K/uL   Basophils Relative 1 %   Basophils Absolute 0.0 0.0 - 0.1 K/uL  Comprehensive metabolic panel     Status: Abnormal   Collection Time: 10/24/16  6:24 PM  Result Value Ref Range   Sodium 139 135 - 145 mmol/L   Potassium 4.0 3.5 - 5.1 mmol/L   Chloride 102 101 -  111 mmol/L   CO2 28 22 - 32 mmol/L   Glucose, Bld 103 (H) 65 - 99 mg/dL   BUN 18 6 - 20 mg/dL   Creatinine, Ser 1.25 (H) 0.61 - 1.24 mg/dL   Calcium 9.6 8.9 - 10.3 mg/dL   Total Protein 7.7 6.5 - 8.1 g/dL   Albumin 4.7 3.5 - 5.0 g/dL   AST 18 15 - 41 U/L   ALT 15 (L) 17 - 63 U/L   Alkaline Phosphatase 53 38 - 126 U/L   Total Bilirubin 0.8 0.3 - 1.2 mg/dL   GFR calc non Af Amer >60 >60 mL/min   GFR calc Af Amer >60 >60 mL/min    Comment: (NOTE) The eGFR has been calculated using the CKD EPI equation. This calculation has not been validated in all clinical situations. eGFR's persistently <60 mL/min signify possible Chronic Kidney Disease.    Anion gap 9 5 - 15  Ethanol     Status: None   Collection Time: 10/24/16  6:24 PM  Result Value Ref Range   Alcohol, Ethyl (B) <5 <5 mg/dL    Comment:        LOWEST DETECTABLE LIMIT FOR SERUM ALCOHOL IS 5 mg/dL FOR MEDICAL PURPOSES ONLY   Salicylate level     Status: None   Collection Time: 10/24/16  6:24 PM  Result Value Ref Range   Salicylate Lvl <3.6 2.8 - 30.0 mg/dL  Acetaminophen level     Status: Abnormal   Collection Time: 10/24/16  6:24 PM  Result Value Ref Range   Acetaminophen (Tylenol), Serum <10 (L) 10 - 30 ug/mL    Comment:        THERAPEUTIC CONCENTRATIONS VARY SIGNIFICANTLY. A RANGE OF 10-30 ug/mL MAY BE AN EFFECTIVE CONCENTRATION FOR MANY PATIENTS. HOWEVER, SOME ARE  BEST TREATED AT CONCENTRATIONS OUTSIDE THIS RANGE. ACETAMINOPHEN CONCENTRATIONS >150 ug/mL AT 4 HOURS AFTER INGESTION AND >50 ug/mL AT 12 HOURS AFTER INGESTION ARE OFTEN ASSOCIATED WITH TOXIC REACTIONS.     Blood Alcohol level:  Lab Results  Component Value Date   ETH <5 10/24/2016   ETH <5 62/94/7654    Metabolic Disorder Labs:  No results found for: HGBA1C, MPG No results found for: PROLACTIN No results found for: CHOL, TRIG, HDL, CHOLHDL, VLDL, LDLCALC  Current Medications: Current Facility-Administered Medications  Medication Dose Route Frequency Provider Last Rate Last Dose  . acetaminophen (TYLENOL) tablet 650 mg  650 mg Oral Q4H PRN Patrecia Pour, NP      . alum & mag hydroxide-simeth (MAALOX/MYLANTA) 200-200-20 MG/5ML suspension 30 mL  30 mL Oral Q6H PRN Patrecia Pour, NP      . divalproex (DEPAKOTE) DR tablet 500 mg  500 mg Oral BID Patrecia Pour, NP   500 mg at 10/26/16 6503  . magnesium hydroxide (MILK OF MAGNESIA) suspension 30 mL  30 mL Oral Daily PRN Patrecia Pour, NP      . nicotine (NICODERM CQ - dosed in mg/24 hours) patch 21 mg  21 mg Transdermal Daily Patrecia Pour, NP      . ondansetron [facility]) tablet 4 mg  4 mg Oral Q8H PRN Patrecia Pour, NP      . risperiDONE (RISPERDAL) tablet 1 mg  1 mg Oral BID Patrecia Pour, NP   1 mg at 10/26/16 0741  . traZODone (DESYREL) tablet 100 mg  100 mg Oral QHS Patrecia Pour, NP       PTA Medications: Prescriptions Prior to  Admission  Medication Sig Dispense Refill Last Dose  . divalproex (DEPAKOTE) 500 MG DR tablet Take 1 tablet (500 mg total) by mouth 2 (two) times daily. (Patient not taking: Reported on 10/24/2016) 60 tablet 0 Not Taking at Unknown time  . risperiDONE (RISPERDAL) 1 MG tablet Take 1 tablet (1 mg total) by mouth 2 (two) times daily. (Patient not taking: Reported on 10/24/2016) 60 tablet 0 Not Taking at Unknown time  . traZODone (DESYREL) 100 MG tablet Take 1 tablet (100 mg total) by mouth at  bedtime. (Patient not taking: Reported on 10/24/2016) 30 tablet 0 Not Taking at Unknown time    Musculoskeletal: Strength & Muscle Tone: within normal limits Gait & Station: normal Patient leans: N/A  Psychiatric Specialty Exam: Physical Exam  Vitals reviewed. Constitutional: He appears well-developed.  Cardiovascular: Normal rate.   Psychiatric: He has a normal mood and affect. His behavior is normal.    Review of Systems  Psychiatric/Behavioral: Positive for hallucinations. The patient is nervous/anxious and has insomnia.     Blood pressure 116/79, pulse (!) 106, temperature 98.6 F (37 C), temperature source Oral, resp. rate 16, height 6' 2"  (1.88 m), weight 88.9 kg (196 lb), SpO2 100 %.Body mass index is 25.16 kg/m.  General Appearance: Guarded  Eye Contact:  Fair  Speech:  Clear and Coherent and Slow  Volume:  Decreased  Mood:  Dysphoric and Irritable  Affect:  Constricted  Thought Process:  Linear  Orientation:  Full (Time, Place, and Person)  Thought Content:  Delusions, Paranoid Ideation and Rumination  Suicidal Thoughts:  No  Homicidal Thoughts:  No  Memory:  Immediate;   Fair Recent;   Fair Remote;   Fair  Judgement:  Fair  Insight:  Fair  Psychomotor Activity:  Normal  Concentration:  Concentration: Poor  Recall:  [employer] of Knowledge:  Fair  Language:  Good  Akathisia:  No  Handed:  Right  AIMS (if indicated):     Assets:  Communication Skills Resilience Social Support  ADL's:  Intact  Cognition:  WNL  Sleep:        I agree with current treatment plan on 10/26/2016, Patient seen face-to-face for psychiatric evaluation follow-up, chart reviewed and case discussed with the MD Hisada. Reviewed the information documented and agree with the treatment plan.  Treatment Plan Summary: Daily contact with patient to assess and evaluate symptoms and progress in treatment and Medication management   Continue with Risperdal 1 mg and Depakote 500 mg PO BID for  mood stabilization. Continue with Trazodone 100 mg for insomnia Will continue to monitor vitals ,medication compliance and treatment side effects while patient is here.  Reviewed labs: A1C, Lipid, Prolactin, TSH, Valproic level  EKG  ,BAL - , UDS -  CSW will start working on disposition.  Patient to participate in therapeutic milieu  Observation Level/Precautions:  15 minute checks  Laboratory:  CBC Chemistry Profile HCG UA  Psychotherapy:  individual and group session  Medications:  See above  Consultations:  psychiatry  Discharge Concerns: Safety, stabilization, and risk of access to medication and medication stabilization   Estimated LOS:5-7day  Other:     Physician Treatment Plan for Primary Diagnosis: Schizophrenia, paranoid type ([place]) Long Term Goal(s): Improvement in symptoms so as ready for discharge  Short Term Goals: Ability to identify changes in lifestyle to reduce recurrence of condition will improve, Ability to verbalize feelings will improve, Ability to demonstrate self-control will improve, Compliance with prescribed medications will improve and Ability  to identify triggers associated with substance abuse/mental health issues will improve  Physician Treatment Plan for Secondary Diagnosis: Principal Problem:   Schizophrenia, paranoid type ([place])  Long Term Goal(s): Improvement in symptoms so as ready for discharge  Short Term Goals: Ability to verbalize feelings will improve, Ability to maintain clinical measurements within normal limits will improve and Compliance with prescribed medications will improve  I certify that inpatient services furnished can reasonably be expected to improve the patient's condition.    Derrill Center, NP 7/21/201810:37 AM

## 2016-10-26 NOTE — Progress Notes (Signed)
Adult Psychoeducational Group Note  Date:  10/26/2016 Time:  8:43 PM  Group Topic/Focus:  Wrap-Up Group:   The focus of this group is to help patients review their daily goal of treatment and discuss progress on daily workbooks.  Participation Level:  Active  Participation Quality:  Appropriate  Affect:  Appropriate  Cognitive:  Appropriate  Insight: Appropriate  Engagement in Group:  Engaged  Modes of Intervention:  Discussion  Additional Comments: The patient expressed that he rates today a 10.The patient also said that he attended groups.  Octavio Mannshigpen, Thea Holshouser Lee 10/26/2016, 8:43 PM

## 2016-10-27 LAB — TSH: TSH: 3.444 u[IU]/mL (ref 0.350–4.500)

## 2016-10-27 LAB — LIPID PANEL
CHOL/HDL RATIO: 2.7 ratio
CHOLESTEROL: 137 mg/dL (ref 0–200)
HDL: 50 mg/dL (ref >40)
LDL CALC: 75 mg/dL (ref 0–99)
TRIGLYCERIDES: 62 mg/dL (ref <150)
VLDL: 12 mg/dL (ref 0–40)

## 2016-10-27 LAB — VALPROIC ACID LEVEL: Valproic Acid Lvl: 71 ug/mL (ref 50.0–100.0)

## 2016-10-27 NOTE — [facility] Group Notes (Signed)
[facility] LCSW Group Therapy  10/27/2016 11:15 AM  Type of Therapy:  Group Therapy  Participation Level:  Active  Participation Quality:  Appropriate and Attentive  Affect:  Appropriate  Cognitive:  Alert and Oriented  Insight:  Improving  Engagement in Therapy:  Improving  Modes of Intervention:  Discussion  Today's group discussed current progress in preparation for discharge. Group discussion included recognizing tools and insights gained during inpatient process to prepare for discharge. Identifying key elements for success at home including professional supports, social supports, self care and medication compliance. Also discussed assessing usefulness of coping skills in order to manage mood and emotions as they progress toward discharge. Patient did not identify much but did share that for self care he enjoys watching TV.   Beverly Sessionsywan J Jakari Sada MSW, LCSW

## 2016-10-27 NOTE — BHH Counselor (Signed)
Adult Comprehensive Assessment  Patient ID: Paul DownerBrian Canal, male   DOB: Feb 24, 1986, 31 y.o.   MRN: [id number]  Information Source: Information source: Patient  Current Stressors:  Educational / Learning stressors: Thinking about dropping out of school Employment / Job issues: Unemployed, looking for a job Family Relationships: Patient states he has too much time and he is bugging them too much Housing / Lack of housing: Needs money to pay rent and car note  Living/Environment/Situation:  Living Arrangements: Alone Living conditions (as described by patient or guardian): Lives in apartment alone How long has patient lived in current situation?: 6 years What is atmosphere in current home: Other (Comment) ("Chill, sometimes boring.")  Family History:  Marital status: Single Does patient have children?: Yes How many children?: 1 How is patient's relationship with their children?: 31 y/o daughter. Gets along well, wishes he could see her more  Childhood History:  By whom was/is the patient raised?: Both parents Description of patient's relationship with caregiver when they were a child: Love them both Patient's description of current relationship with people who raised him/her: Love them both (same as when growing up.) Does patient have siblings?: Yes Number of Siblings: 1 Description of patient's current relationship with siblings: younger sister, gets along well Did patient suffer any verbal/emotional/physical/sexual abuse as a child?: No Did patient suffer from severe childhood neglect?: No Has patient ever been sexually abused/assaulted/raped as an adolescent or adult?: No Was the patient ever a victim of a crime or a disaster?: No Witnessed domestic violence?: No Has patient been effected by domestic violence as an adult?: No  Education:  Highest grade of school patient has completed: Some college Currently a student?: Yes Name of school: [employer] Learning disability?:  No  Employment/Work Situation:   Employment situation: Unemployed Patient's job has been impacted by current illness: No What is the longest time patient has a held a job?: 3 Years Where was the patient employed at that time?: [employer] Has patient 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 Type of Psychiatric Treatment/Services in Military: Patient thinks this was the evaluation that lead to medical discharge Are There Guns or Other Weapons in Your Home?: No  Financial Resources:   Financial resources: No income Does patient have a Lawyerrepresentative payee or guardian?: No  Alcohol/Substance Abuse:   What has been your use of drugs/alcohol within the last 12 months?: Stopped drinking about 1 month ago when he was at [employer] If attempted suicide, did drugs/alcohol play a role in this?: No Alcohol/Substance Abuse Treatment Hx: Denies past history Has alcohol/substance abuse ever caused legal problems?: Yes (DUI 2014)  Social Support System:   Patient's Community Support System: Good Describe Community Support System: "Even when [employer]'m messing up they still deal with me." Type of faith/religion: Believes in God, goes to church How does patient's faith help to cope with current illness?: "Sometimes it helps. 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 In what areas does patient struggle / problems for patient: Leaving people alone when stressed out  Discharge Plan:   Does patient have access to  transportation?: Yes Will patient be returning to same living situation after discharge?: Yes (However, patient is interested in resources because he cannot afford to pay his rent that is due on 11/06/16) Currently receiving community mental health services: Yes (From Whom) [facility]) Does patient have financial barriers related to discharge medications?: No  Summary/Recommendations:   Summary and Recommendations (to be completed by the evaluator): Patient is 31 year old male who presented to the ED with increasing bizarre behavior. Patient triggers are increasing psychosis. Patient would benefit from milieu of inpatient treatment including group therapy, medication management and discharge planning to support outpatient progress. Patient expected to decrease chronic symptoms and step down to lower level of behavioral health treatment in community setting.  Beverly Sessions. 10/27/2016

## 2016-10-27 NOTE — Plan of Care (Signed)
Problem: Education: Goal: Will be free of psychotic symptoms Outcome: Progressing Nurse discussed depression/anxiety/coping skills with patient.   

## 2016-10-27 NOTE — Progress Notes (Signed)
Nursing Progress Note 1900-0730  D) Patient presents anxious, suspicious and guarded. Patient isolative to his room on the unit but did request writer for trazodone. Patient denies SI/HI/AVH or pain. However, patient reported to be responding inappropriately to possible internal stimuli. Patient contracts for safety on the unit.   A) Emotional support given. 1:1 interaction and active listening provided. Patient medicated as prescribed. Medications and plan of care reviewed with patient. Patient verbalized understanding without further questions. Snacks and fluids provided. Opportunities for questions or concerns presented to patient. Patient encouraged to continue to work on treatment goals. Labs, vital signs and patient behavior monitored throughout shift. Patient safety maintained with q15 min safety checks. Low fall risk precautions in place and reviewed with patient; patient verbalized understanding.  R) Patient receptive to interaction with nurse. Patient remains safe on the unit at this time. Patient denies any adverse medication reactions at this time. Patient is resting in bed without complaints. Will continue to monitor.

## 2016-10-27 NOTE — Progress Notes (Signed)
[facility] MD Progress Note  10/27/2016 9:02 AM Paul Mendez  MRN:  [id number]   Subjective: Patient reports " I am feeling okay"  Objective:  Paul Mendez seen resting in dayroom interacting with peers. Patient reports feeling better. Reports he has been taking is medications as prescribed. Reports " I have been looking for a job and I am trying to get on my feet so I don't take my medications everyday at the same time, however states he is taking is medications daily. Valproic level 71  .Denies suicidal or homicidal ideation. Denies auditory or visual hallucination and does not appear to be responding to internal stimuli. Mild thought blocking noted throughout this discussion. Patient denies depression or depressive symptoms.  Reports a good appetite and states he is resting well.Support, encouragement and reassurance was provided.    Principal Problem: Schizophrenia, paranoid type (HCC) Diagnosis:   Patient Active Problem List   Diagnosis Date Noted  . Schizophrenia, paranoid type (HCC) [F20.0] 10/20/2016   Total Time spent with patient: 30 minutes  Past Psychiatric History:   Past Medical History:  Past Medical History:  Diagnosis Date  . Genital warts   . Schizophrenia (HCC)    History reviewed. No pertinent surgical history. Family History: History reviewed. No pertinent family history. Family Psychiatric  History:  Social History:  History  Alcohol Use  . Yes    Comment: occ     History  Drug Use No    Social History   Social History  . Marital status: Single    Spouse name: N/A  . Number of children: N/A  . Years of education: N/A   Social History Main Topics  . Smoking status: Current Some Day Smoker    Types: Cigarettes  . Smokeless tobacco: Never Used  . Alcohol use Yes     Comment: occ  . Drug use: No  . Sexual activity: Not Asked   Other Topics Concern  . None   Social History Narrative  . None   Additional Social History:                          Sleep: Fair  Appetite:  Fair  Current Medications: Current Facility-Administered Medications  Medication Dose Route Frequency Provider Last Rate Last Dose  . acetaminophen (TYLENOL) tablet 650 mg  650 mg Oral Q4H PRN Charm Rings, NP      . alum & mag hydroxide-simeth (MAALOX/MYLANTA) 200-200-20 MG/5ML suspension 30 mL  30 mL Oral Q6H PRN Charm Rings, NP      . divalproex (DEPAKOTE) DR tablet 500 mg  500 mg Oral BID Charm Rings, NP   500 mg at 10/27/16 0846  . magnesium hydroxide (MILK OF MAGNESIA) suspension 30 mL  30 mL Oral Daily PRN Charm Rings, NP      . nicotine (NICODERM CQ - dosed in mg/24 hours) patch 21 mg  21 mg Transdermal Daily Charm Rings, NP      . ondansetron [facility]) tablet 4 mg  4 mg Oral Q8H PRN Charm Rings, NP      . risperiDONE (RISPERDAL) tablet 1 mg  1 mg Oral BID Charm Rings, NP   1 mg at 10/27/16 0846  . traZODone (DESYREL) tablet 100 mg  100 mg Oral QHS Charm Rings, NP   100 mg at 10/26/16 2108    Lab Results:  Results for orders placed or performed during the hospital encounter of  10/25/16 (from the past 48 hour(s))  Valproic acid level     Status: None   Collection Time: 10/27/16  6:34 AM  Result Value Ref Range   Valproic Acid Lvl 71 50.0 - 100.0 ug/mL    Comment: Performed at [facility], [address] [place], [place] [address]  TSH     Status: None   Collection Time: 10/27/16  6:34 AM  Result Value Ref Range   TSH 3.444 0.350 - 4.500 uIU/mL    Comment: Performed by a 3rd Generation assay with a functional sensitivity of <=0.01 uIU/mL. Performed at [facility], [address] [place], [place] [address]     Blood Alcohol level:  Lab Results  Component Value Date   ETH <5 10/24/2016   ETH <5 10/19/2016    Metabolic Disorder Labs: No results found for: HGBA1C, MPG No results found for: PROLACTIN No results found for: CHOL, TRIG, HDL, CHOLHDL, VLDL,  LDLCALC  Physical Findings: AIMS:  , ,  ,  ,    CIWA:    COWS:     Musculoskeletal: Strength & Muscle Tone: within normal limits Gait & Station: normal Patient leans: N/A  Psychiatric Specialty Exam: Physical Exam  Vitals reviewed. Constitutional: He is oriented to person, place, and time. He appears well-developed.  Cardiovascular: Normal rate.   Neurological: He is alert and oriented to person, place, and time.  Psychiatric: He has a normal mood and affect. His behavior is normal.    ROS  Blood pressure 117/75, pulse 68, temperature 97.8 F (36.6 C), resp. rate 20, height 6\' 2"  (1.88 m), weight 88.9 kg (196 lb), SpO2 100 %.Body mass index is 25.16 kg/m.  General Appearance: Casual  Eye Contact:  Good intense staring at time    Speech:  Clear and Coherent  Volume:  Decreased  Mood:  Euthymic  Affect:  Depressed and Flat  Thought Process:  Coherent  Orientation:  Full (Time, Place, and Person)  Thought Content:  Hallucinations: Auditory and Paranoid Ideation  Suicidal Thoughts:  No  Homicidal Thoughts:  No  Memory:  Immediate;   Fair Recent;   Fair Remote;   Fair  Judgement:  Fair  Insight:  Fair  Psychomotor Activity:  Normal  Concentration:  Concentration: Fair  Recall:  [employer]  Fund of Knowledge:  Fair  Language:  Fair  Akathisia:  No  Handed:  Right  AIMS (if indicated):     Assets:  Communication Skills Desire for Improvement Resilience Social Support  ADL's:  Intact  Cognition:  WNL  Sleep:        I agree with current treatment plan on 10/27/2016, Patient seen face-to-face for psychiatric evaluation follow-up, chart reviewed.  Reviewed the information documented and agree with the treatment plan.  Treatment Plan Summary: Daily contact with patient to assess and evaluate symptoms and progress in treatment and Medication management   Continue with Risperdal 1 mg and Depakote 500mg  for mood stabilization. Continue with Trazodone 100 mg for  insomnia  Will continue to monitor vitals ,medication compliance and treatment side effects while patient is here.  Reviewed labs,BAL - , UDS -. TSH 3.44, Valproic acid level 71 on 7/22 CSW will start working on disposition.  Patient to participate in therapeutic milieu  Oneta Rackanika N Daltyn Degroat, NP 10/27/2016, 9:02 AM

## 2016-10-27 NOTE — BHH Group Notes (Signed)
Pt attended wrap up group and stated he met his goal for today. Pt did not need any items from staff. Trudie Buckler, MHT

## 2016-10-27 NOTE — Progress Notes (Signed)
D:  Patient's self inventory sheet, patient sleeps good, sleep medication helpful.  Good appetite, normal energy level, good concentration.  Denied depression, rated hopeless and anxiety #1.  Denied withdrawals.  Denied SI.  Denied physical problems.  Denied pain.  Goal is to be discharged.  Plans "to show I can relax".  No discharge plans.] A:  Medications administered per MD orders.  Emotional support and encouragement given patient. R:  Denied SI and HI, contracts for safety.  Denied A/V hallucinations.  Safety maintained with 15 minute checks.

## 2016-10-27 NOTE — Plan of Care (Signed)
Problem: Health Behavior/Discharge Planning: Goal: Compliance with treatment plan for underlying cause of condition will improve Outcome: Progressing Patient requesting and taking medications as prescribed.  Problem: Safety: Goal: Periods of time without injury will increase Outcome: Progressing Patient is on q15 minute safety checks and low fall risk precautions. Patient contracts for safety on the unit and remains safe at this time.

## 2016-10-28 DIAGNOSIS — F39 Unspecified mood [affective] disorder: Secondary | ICD-10-CM

## 2016-10-28 NOTE — Tx Team (Signed)
Interdisciplinary Treatment and Diagnostic Plan Update  10/28/2016 Time of Session: 8:30 AM Paul Mendez MRN: [id number]  Principal Diagnosis: Schizophrenia, paranoid type [facility])  Secondary Diagnoses: Principal Problem:   Schizophrenia, paranoid type (HCC)   Current Medications:  Current Facility-Administered Medications  Medication Dose Route Frequency Provider Last Rate Last Dose  . acetaminophen (TYLENOL) tablet 650 mg  650 mg Oral Q4H PRN Charm Rings, NP      . alum & mag hydroxide-simeth (MAALOX/MYLANTA) 200-200-20 MG/5ML suspension 30 mL  30 mL Oral Q6H PRN Charm Rings, NP      . divalproex (DEPAKOTE) DR tablet 500 mg  500 mg Oral BID Charm Rings, NP   500 mg at 10/28/16 0754  . magnesium hydroxide (MILK OF MAGNESIA) suspension 30 mL  30 mL Oral Daily PRN Charm Rings, NP      . nicotine (NICODERM CQ - dosed in mg/24 hours) patch 21 mg  21 mg Transdermal Daily Charm Rings, NP      . ondansetron [facility]) tablet 4 mg  4 mg Oral Q8H PRN Charm Rings, NP      . risperiDONE (RISPERDAL) tablet 1 mg  1 mg Oral BID Charm Rings, NP   1 mg at 10/28/16 0754  . traZODone (DESYREL) tablet 100 mg  100 mg Oral QHS Charm Rings, NP   100 mg at 10/27/16 2304   PTA Medications: Prescriptions Prior to Admission  Medication Sig Dispense Refill Last Dose  . divalproex (DEPAKOTE) 500 MG DR tablet Take 1 tablet (500 mg total) by mouth 2 (two) times daily. (Patient not taking: Reported on 10/24/2016) 60 tablet 0 Not Taking at Unknown time  . risperiDONE (RISPERDAL) 1 MG tablet Take 1 tablet (1 mg total) by mouth 2 (two) times daily. (Patient not taking: Reported on 10/24/2016) 60 tablet 0 Not Taking at Unknown time  . traZODone (DESYREL) 100 MG tablet Take 1 tablet (100 mg total) by mouth at bedtime. 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   Physician Treatment Plan for Primary Diagnosis: Schizophrenia, paranoid type (HCC) Long Term Goal(s): Improvement in symptoms so as ready for discharge Improvement in symptoms so as ready for discharge   Short Term Goals: Ability to identify changes in lifestyle to reduce recurrence of condition will improve Ability to verbalize feelings will improve Ability to demonstrate self-control will improve Compliance with prescribed medications will improve Ability to identify triggers associated with substance abuse/mental health issues will improve Ability to verbalize feelings will improve Ability to maintain clinical measurements within normal limits will improve Compliance with prescribed medications will improve  Medication Management: Evaluate patient's response, side effects, and tolerance of medication regimen.  Therapeutic Interventions: 1 to 1 sessions, Unit Group sessions and Medication administration.  Evaluation of Outcomes: Progressing  Physician Treatment Plan for Secondary Diagnosis: Principal Problem:   Schizophrenia, paranoid type (HCC)  Long Term Goal(s): Improvement in symptoms so as ready for discharge Improvement in symptoms so as ready for discharge   Short Term Goals: Ability to identify changes in lifestyle to reduce recurrence of condition will improve Ability to verbalize feelings will improve Ability to demonstrate self-control will improve Compliance with prescribed medications will improve Ability to identify  triggers associated with substance abuse/mental health issues will improve Ability to verbalize feelings will improve Ability to maintain clinical measurements within normal limits will improve Compliance with prescribed  medications will improve     Medication Management: Evaluate patient's response, side effects, and tolerance of medication regimen.  Therapeutic Interventions: 1 to 1 sessions, Unit Group sessions and Medication administration.  Evaluation of Outcomes: Progressing   RN Treatment Plan for Primary Diagnosis: Schizophrenia, paranoid type (HCC) Long Term Goal(s): Knowledge of disease and therapeutic regimen to maintain health will improve  Short Term Goals: Ability to participate in decision making will improve, Ability to identify and develop effective coping behaviors will improve and Compliance with prescribed medications will improve  Medication Management: RN will administer medications as ordered by provider, will assess and evaluate patient's response and provide education to patient for prescribed medication. RN will report any adverse and/or side effects to prescribing provider.  Therapeutic Interventions: 1 on 1 counseling sessions, Psychoeducation, Medication administration, Evaluate responses to treatment, Monitor vital signs and CBGs as ordered, Perform/monitor CIWA, COWS, AIMS and Fall Risk screenings as ordered, Perform wound care treatments as ordered.  Evaluation of Outcomes: Progressing   LCSW Treatment Plan for Primary Diagnosis: Schizophrenia, paranoid type (HCC) Long Term Goal(s): Safe transition to appropriate next level of care at discharge, Engage patient in therapeutic group addressing interpersonal concerns.  Short Term Goals: Engage patient in aftercare planning with referrals and resources, Facilitate acceptance of mental health diagnosis and concerns, Identify triggers associated with mental health/substance abuse issues and Increase skills for wellness and recovery  Therapeutic Interventions: Assess for all discharge needs, 1 to 1 time with Social worker, Explore available resources and support systems, Assess for adequacy in community support network, Educate  family and significant other(s) on suicide prevention, Complete Psychosocial Assessment, Interpersonal group therapy.  Evaluation of Outcomes: Progressing   Progress in Treatment: Attending groups: Yes. Participating in groups: Yes. Taking medication as prescribed: Yes. Toleration medication: Yes. Family/Significant other contact made: No, will contact:  mother, Velna HatchetMichelle Lewis Patient understands diagnosis: Yes. Discussing patient identified problems/goals with staff: Yes. Medical problems stabilized or resolved: Yes. Denies suicidal/homicidal ideation: Admitted w HI, but says "I thought someone might kill me" Issues/concerns per patient self-inventory: No. and As evidenced by:  none at this time Other: NA  New problem(s) identified: Yes, Describe:  stable aftercare plan, assess family/community support  New Short Term/Long Term Goal(s): aftercare appointments, family contact  Discharge Plan or Barriers: CSW assessing  Reason for Continuation of Hospitalization: Hallucinations Homicidal ideation Medication stabilization  Estimated Length of Stay:  5 - 7 days, DC 11/01/16  Attendees: Patient: 10/28/2016 11:40 AM  Physician: Alyse LowA Kumar MD 10/28/2016 11:40 AM  Nursing: Meriam SpragueBeverly RN 10/28/2016 11:40 AM  RN Care Manager: Sondra BargesJ Clark RN CM 10/28/2016 11:40 AM  Social Worker: Governor RooksA Cunningham LCSW 10/28/2016 11:40 AM  Recreational Therapist:  10/28/2016 11:40 AM  Other:  10/28/2016 11:40 AM  Other:  10/28/2016 11:40 AM  Other: 10/28/2016 11:40 AM    Scribe for Treatment Team: Sallee LangeAnne C Cunningham, LCSW 10/28/2016 11:40 AM

## 2016-10-28 NOTE — Progress Notes (Signed)
CSW spoke with patient's mother Paul Mendez regarding patient returning back home at discharge. SPE discussed. Mother reports she is concerned with patient returning home because his behavior has not improved. Mother reports the patient continues to be aggressive, refuses to take his meds and his behavior is just uncontrollable. Mother reports she does not think it's safe for him to return back home. Mother reports no one feels safe with the patient returning home to live with them as he has made statements that he wanted to kill everyone within the home. Mother reports the patient returning to her home is not an option. Mother aware that CSW may follow up for additional information if needed. No other concerns to report at this time.   CSW will continue to follow and provide support to patient and family while in the hospital.   Paul Mendez, [facility] CenterCSWA Clinical Social Worker [facility] Ph: [phone]

## 2016-10-28 NOTE — Progress Notes (Signed)
Recreation Therapy Notes  Date: 10/28/2016 Time: 10:00am Location: [address] Dayroom  Group Topic: Coping Skills  Goal Area(s) Addresses:  Pt will successfully identify at least five things that are preventing them from moving forward. Pt will successfully identify at least two coping skills they can use to move forward from the places they are currently "stuck."  Behavioral Response: Engaged  Intervention: Art  Activity: Pt was given a piece of printer paper and asked to draw a spider web. Pt was asked to identify at least 5 things holding them back and 10 coping skills to help them get past the things that are holding them back. Pt then shared their spider webs with peers.  Education: PharmacologistCoping Skills, Discharge Planning  Education Outcome: Acknowledges understanding   Clinical Observations/Feedback:  Pt actively participated in activity. Pt was able to identify success, loneliness and drive. Pt identified coping skills such as living his life, being creative and enjoying others company.  Marvell Fullerachel Meyer, Recreation Therapy Intern  Caroll RancherMarjette Nahsir Mendez, LRT/CTRS

## 2016-10-28 NOTE — [facility] Group Notes (Signed)
[facility] LCSW Group Therapy  10/28/2016 1:30 - 2:15 PM  Todays Topic: Overcoming Obstacles. Patients identified one short term goal and potential obstacles in reaching this goal. Patients processed barriers involved in overcoming these obstacles. Patients identified steps necessary for overcoming these obstacles and explored motivation (internal and external) for facing these difficulties head on.  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  Appeared to shut down quickly when challenged by peer.  Patient was able to articulate that he wanted to gain more information about employment options, was not able to identify any less concrete goal for mental wellness.   Sallee Langenne C Rolondo Pierre  LCSW 10/28/2016, 3:16 PM

## 2016-10-28 NOTE — Plan of Care (Signed)
Problem: Activity: Goal: Sleeping patterns will improve Outcome: Not Progressing Patient taking sleep medications but noted to yell in his sleep on two occassions this evening. Patient unable to verbalize cause of yelling. Patient observed awake at several intervals of the night.  Problem: Safety: Goal: Periods of time without injury will increase Outcome: Progressing Patient is on q15 minute safety checks and low fall risk precautions. Patient contracts for safety on the unit and remains safe at this time.

## 2016-10-28 NOTE — Progress Notes (Signed)
Adult Psychoeducational Group Note  Date:  10/28/2016 Time:  8:37 PM  Group Topic/Focus:  Wrap-Up Group:   The focus of this group is to help patients review their daily goal of treatment and discuss progress on daily workbooks.  Participation Level:  Active  Participation Quality:  Appropriate  Affect:  Appropriate  Cognitive:  Appropriate  Insight: Appropriate  Engagement in Group:  Engaged  Modes of Intervention:  Discussion  Additional Comments: The patient expressed that he attend group.The patient also said that he rates today a 10.  Octavio Mannshigpen, Rakisha Pincock Lee 10/28/2016, 8:37 PM

## 2016-10-28 NOTE — [facility] Group Notes (Signed)
[facility]   10/28/2016 9:53 AM  Participation Quality:  Appropriate  Plan for Discharge/Comments:  Return home to family, [employer]  Transportation Means: family  Supports: family; wants information on financial assistance "in case my plans dont work out"  Praxairnne C Jariya Reichow

## 2016-10-28 NOTE — [facility] Suicide Risk Assessment (Signed)
[facility] INPATIENT:  Family/Significant Other Suicide Prevention Education  Suicide Prevention Education:  Education Completed; Velna HatchetMichelle Lewis mother [phone],  (name of family member/significant other) has been identified by the patient as the family member/significant other with whom the patient will be residing, and identified as the person(s) who will aid the patient in the event of a mental health crisis (suicidal ideations/suicide attempt).  With written consent from the patient, the family member/significant other has been provided the following suicide prevention education, prior to the and/or following the discharge of the patient.  The suicide prevention education provided includes the following:  Suicide risk factors  Suicide prevention and interventions  National Suicide Hotline telephone number  [facility] assessment telephone number  [facility] Emergency Assistance 911  [facility] and/or [facility]idential Mobile Crisis Unit telephone number  Request made of family/significant other to:  Remove weapons (e.g., guns, rifles, knives), all items previously/currently identified as safety concern.    Remove drugs/medications (over-the-counter, prescriptions, illicit drugs), all items previously/currently identified as a safety concern.  The family member/significant other verbalizes understanding of the suicide prevention education information provided.  The family member/significant other agrees to remove the items of safety concern listed above.  CSW Smyre provided SPE and entered progress note w details of conversation w mother.  Mother is not willing for patient to return home at this time.  MD will be advised in AM meeting  Paul Mendez 10/28/2016, 5:13 PM

## 2016-10-28 NOTE — Progress Notes (Addendum)
D:  Patient's self inventory sheet, patient sleeps good, no sleep medication given.  Good appetite, high energy level, good concentration.  Denied depression, hopeless and anxiety.  Denied withdrawals.  SI yes/no.  Physical problems, yes/no.  Denied physical pain.  Goal is to be employed.  Plans to talk to staff.  No discharge plans. A:  Medications administered per MD orders.  Emotional support and encouragement given patient. R:  Denied SI and HI, contracts for safety.  Denied A/V hallucinations.  Safety maintained with 15 minute checks.  Patient denied SI and HI while talking to nurse this morning, contracts for safety.  Denied A/V hallucinations.

## 2016-10-28 NOTE — Progress Notes (Signed)
[facility] MD Progress Note  10/28/2016 2:38 PM Paul Mendez  MRN:  [id number]   Subjective: Paul Mendez reports " I am okay, doing well, thank you"  Objective:  Paul Mendez is seen, chart reviewed. Case discussed with the treatment team. He is resting in in his room, lying down in his bed. He reports doing well. Reports he has been taking is medications as prescribed. Denies any adverse effects or reactions .Denies suicidal or homicidal ideation. Denies auditory or visual hallucination and does not appear to be responding to internal stimuli. Noted some thought blocking, mild. Patient denies depression or depressive symptoms.  Reports a good appetite and states he is resting well. He says he is attending group sessions. Staff reports that patient is visible on the unit. No disruptive behavior noted. There are no changes made on his current treatment regimen. Support, encouragement and reassurance was provided.   Principal Problem: Schizophrenia, paranoid type (HCC)  Diagnosis:   Patient Active Problem List   Diagnosis Date Noted  . Schizophrenia, paranoid type (HCC) [F20.0] 10/20/2016   Total Time spent with patient: 15 minutes  Past Psychiatric History:   Past Medical History:  Past Medical History:  Diagnosis Date  . Genital warts   . Schizophrenia (HCC)    History reviewed. No pertinent surgical history. Family History: History reviewed. No pertinent family history.  Family Psychiatric  History: See H&P  Social History:  History  Alcohol Use  . Yes    Comment: occ     History  Drug Use No    Social History   Social History  . Marital status: Single    Spouse name: N/A  . Number of children: N/A  . Years of education: N/A   Social History Main Topics  . Smoking status: Current Some Day Smoker    Types: Cigarettes  . Smokeless tobacco: Never Used  . Alcohol use Yes     Comment: occ  . Drug use: No  . Sexual activity: Not Asked   Other Topics Concern  . None   Social History  Narrative  . None   Additional Social History:   Sleep: Fair  Appetite:  Fair  Current Medications: Current Facility-Administered Medications  Medication Dose Route Frequency Provider Last Rate Last Dose  . acetaminophen (TYLENOL) tablet 650 mg  650 mg Oral Q4H PRN Charm Rings, NP      . alum & mag hydroxide-simeth (MAALOX/MYLANTA) 200-200-20 MG/5ML suspension 30 mL  30 mL Oral Q6H PRN Charm Rings, NP      . divalproex (DEPAKOTE) DR tablet 500 mg  500 mg Oral BID Charm Rings, NP   500 mg at 10/28/16 0754  . magnesium hydroxide (MILK OF MAGNESIA) suspension 30 mL  30 mL Oral Daily PRN Charm Rings, NP      . nicotine (NICODERM CQ - dosed in mg/24 hours) patch 21 mg  21 mg Transdermal Daily Charm Rings, NP      . ondansetron [facility]) tablet 4 mg  4 mg Oral Q8H PRN Charm Rings, NP      . risperiDONE (RISPERDAL) tablet 1 mg  1 mg Oral BID Charm Rings, NP   1 mg at 10/28/16 0754  . traZODone (DESYREL) tablet 100 mg  100 mg Oral QHS Charm Rings, NP   100 mg at 10/27/16 2304   Lab Results:  Results for orders placed or performed during the hospital encounter of 10/25/16 (from the past 48 hour(s))  Lipid panel  Status: None   Collection Time: 10/27/16  6:34 AM  Result Value Ref Range   Cholesterol 137 0 - 200 mg/dL   Triglycerides 62 <161<150 mg/dL   HDL 50 >09>40 mg/dL   Total CHOL/HDL Ratio 2.7 RATIO   VLDL 12 0 - 40 mg/dL   LDL Cholesterol 75 0 - 99 mg/dL    Comment:        Total Cholesterol/HDL:CHD Risk Coronary Heart Disease Risk Table                     Men   Women  1/2 Average Risk   3.4   3.3  Average Risk       5.0   4.4  2 X Average Risk   9.6   7.1  3 X Average Risk  23.4   11.0        Use the calculated Patient Ratio above and the CHD Risk Table to determine the patient's CHD Risk.        ATP III CLASSIFICATION (LDL):  <100     mg/dL   Optimal  [id number]  mg/dL   Near or Above                    Optimal  130-159  mg/dL   Borderline   [id number]  mg/dL   High  >478>190     mg/dL   Very High Performed at [facility], [address] [place], [place] [address]   Valproic acid level     Status: None   Collection Time: 10/27/16  6:34 AM  Result Value Ref Range   Valproic Acid Lvl 71 50.0 - 100.0 ug/mL    Comment: Performed at [facility], [address] [place], [place] [address]  TSH     Status: None   Collection Time: 10/27/16  6:34 AM  Result Value Ref Range   TSH 3.444 0.350 - 4.500 uIU/mL    Comment: Performed by a 3rd Generation assay with a functional sensitivity of <=0.01 uIU/mL. Performed at [facility], [address] [place], [place] [address]    Blood Alcohol level:  Lab Results  Component Value Date   ETH <5 10/24/2016   ETH <5 10/19/2016   Metabolic Disorder Labs: No results found for: HGBA1C, MPG No results found for: PROLACTIN Lab Results  Component Value Date   CHOL 137 10/27/2016   TRIG 62 10/27/2016   HDL 50 10/27/2016   CHOLHDL 2.7 10/27/2016   VLDL 12 10/27/2016   LDLCALC 75 10/27/2016   Physical Findings: AIMS: Facial and Oral Movements Muscles of Facial Expression: None, normal Lips and Perioral Area: None, normal Jaw: None, normal Tongue: None, normal,Extremity Movements Upper (arms, wrists, hands, fingers): None, normal Lower (legs, knees, ankles, toes): None, normal, Trunk Movements Neck, shoulders, hips: None, normal, Overall Severity Severity of abnormal movements (highest score from questions above): None, normal Incapacitation due to abnormal movements: None, normal Patient's awareness of abnormal movements (rate only patient's report): No Awareness, Dental Status Current problems with teeth and/or dentures?: No Does patient usually wear dentures?: No  CIWA:  CIWA-Ar Total: 1 COWS:  COWS Total Score: 1  Musculoskeletal: Strength & Muscle Tone: within normal limits Gait & Station: normal Patient leans: N/A  Psychiatric  Specialty Exam: Physical Exam  Vitals reviewed. Constitutional: He is oriented to person, place, and time. He appears well-developed.  Cardiovascular: Normal rate.   Neurological: He is alert and  oriented to person, place, and time.  Psychiatric: He has a normal mood and affect. His behavior is normal.    ROS: Nurses notes & Vital signs reviewed.  Blood pressure 90/72, pulse 96, temperature 97.9 F (36.6 C), temperature source Oral, resp. rate 16, height 6\' 2"  (1.88 m), weight 88.9 kg (196 lb), SpO2 100 %.Body mass index is 25.16 kg/m.  General Appearance: Casual  Eye Contact:  Good, stares  Speech:  Clear and Coherent  Volume:  Decreased  Mood:  "I'm okay, doing well"  Affect:  Congruent  Thought Process:  Coherent and Descriptions of Associations: Intact  Orientation:  Full (Time, Place, and Person)  Thought Content:  Hallucinations: Auditory and Paranoid Ideation  Suicidal Thoughts:  Denies any thoughts, plans or intent  Homicidal Thoughts:  Denies any thoughts, plans or intent.  Memory:  Immediate;   Fair Recent;   Fair Remote;   Fair  Judgement:  Fair  Insight:  Fair  Psychomotor Activity:  Decreased  Concentration:  Concentration: Fair  Recall:  [employer] of Knowledge:  Fair  Language:  Fair  Akathisia:  No  Handed:  Right  AIMS (if indicated):     Assets:  Communication Skills Desire for Improvement Resilience Social Support  ADL's:  Intact  Cognition:  WNL  Sleep:  Number of Hours: 4   Treatment Plan Summary: Daily contact with patient to assess and evaluate symptoms and progress in treatment and Medication management   Will continue today 10/28/16 plan as below except where it is noted.  Mood control: Continue with Risperdal 1 mg twice daily.   Mood stabilization: Continue Depakote 500mg  for mood stabilization.  Insomnia: Continue Trazodone 100 mg for insomnia.  Nicotine withdrawal: Continue Nicotine patch 21 mg Q 24 hours.  Will continue to  monitor vitals ,medication compliance and treatment side effects while patient is here.   Reviewed labs,BAL - , UDS -. TSH 3.44, Valproic acid level 71 on 7/22  CSW will start working on disposition.  Patient to participate in therapeutic milieu  Sanjuana Kava, NP, PMHNP, FNP-BC. 10/28/2016, 2:38 PMPatient ID: Paul Mendez, male   DOB: [birth year], 30 y.o.   MRN: [id number]

## 2016-10-28 NOTE — Progress Notes (Signed)
Nursing Progress Note 1900-0730  D) Patient presents with flat affect and thought blocking. Patient is observed up in the milieu and took trazodone this evening. Patient has been observed by staff yelling twice in his sleep. Patient is unable to verbalize cause of screaming to staff but states "I thought I was doing better than I am". Patient denies SI/HI/AVH or pain. Patient contracts for safety on the unit.  A) Emotional support given. 1:1 interaction and active listening provided. Patient medicated as prescribed. Medications and plan of care reviewed with patient. Patient verbalized understanding without further questions.  Snacks and fluids provided. Opportunities for questions or concerns presented to patient. Patient encouraged to continue to work on treatment goals. Labs, vital signs and patient behavior monitored throughout shift. Patient safety maintained with q15 min safety checks. Low fall risk precautions in place and reviewed with patient; patient verbalized understanding.  R) Patient receptive to interaction with nurse. Patient remains safe on the unit at this time. Patient denies any adverse medication reactions at this time. Patient is resting in bed without complaints. Will continue to monitor.

## 2016-10-28 NOTE — Progress Notes (Signed)
Recreation Therapy Notes  INPATIENT RECREATION THERAPY ASSESSMENT  Patient Details Name: Paul Mendez MRN: [id number] DOB: 27-Aug-1985 Today's Date: 10/28/2016  Pt reported his reason for admission was because he did not feel like himself and he was having thoughts such as people getting tortured and he was not sure if they were real or not.  Patient Stressors: Family, Work   Pt reported that his mom doesn't want to talk to him. Pt reported he thought his mom was being held hostage because she did not want to see him.  Pt reported he can't find a job.  Coping Skills:   Isolate, Arguments, Avoidance, Exercise, Talking, Music, Sports  Personal Challenges: Decision-Making, Expressing Yourself, Relationships, Social Interaction, Trusting Others  Leisure Interests (2+):  Ashby Dawesature - Going to the park, [employer] - Surveyor, miningMovies  Awareness of Community Resources:  Yes  Community Resources:  Research scientist (physical sciences)Movie Theaters, [employer]  Current Use: Yes  If no, Barriers?:    Patient Strengths:  "Horticulturist, commercialstamina and hard worker"  Patient Identified Areas of Improvement:  "need to find a balance"  Current Recreation Participation:  "not sure"  Patient Goal for Hospitalization:  "maintain good feeling when I'm gone"  [place]  [place]idence[place]   Current SI (including self-harm):  No  Current HI:  No  Consent to Intern Participation: Yes  Marvell FullerRachel Meyer, Recreation Therapy Intern  Marvell FullerRachel Meyer 10/28/2016, 2:52 PM    Caroll RancherMarjette Tysheka Fanguy, LRT/CTRS

## 2016-10-28 NOTE — Plan of Care (Signed)
Problem: Coping: Goal: Ability to cope will improve Outcome: Progressing Nurse discussed depression/anxiety/coping skills with patient.    

## 2016-10-29 MED ORDER — RISPERIDONE 2 MG PO TABS
2.0000 mg | ORAL_TABLET | Freq: Two times a day (BID) | ORAL | Status: DC
  Administered 2016-10-29 – 2016-10-31 (×4): 2 mg via ORAL
  Filled 2016-10-29: qty 1
  Filled 2016-10-29: qty 14
  Filled 2016-10-29 (×5): qty 1
  Filled 2016-10-29: qty 14
  Filled 2016-10-29: qty 1

## 2016-10-29 MED ORDER — RISPERIDONE 1 MG PO TABS
1.0000 mg | ORAL_TABLET | Freq: Once | ORAL | Status: AC
  Administered 2016-10-29: 1 mg via ORAL
  Filled 2016-10-29: qty 1

## 2016-10-29 NOTE — Plan of Care (Signed)
Problem: Safety: Goal: Ability to demonstrate self-control will improve Outcome: Progressing Pt has not exhibited physical aggression towards self or others thus far this shift. Calm even when a male peer has been intrusive towards him.

## 2016-10-29 NOTE — Progress Notes (Signed)
[facility] MD Progress Note  10/29/2016 11:01 AM Paul Mendez  MRN:  [id number]   Subjective: Paul Mendez reports "I'm doing good. I was hoping that I will be getting discharged today, but someone has already told me that I will be going home tomorrow, but I don't believe it. I went to group sessions this morning, I did not like how I was greeted, I got upset & left"."  Objective:  Paul Mendez is seen, chart reviewed. Case discussed with the treatment team. He is resting in in his room, lying down in his bed. He reports doing well. Reports he has been taking is medications as prescribed. Denies any adverse effects or reactions .Denies suicidal or homicidal ideation. Denies auditory or visual hallucination and does not appear to be responding to internal stimuli. Continues to present with some form of thought blocking whereby he is slow to respond to questions. He says he got upset at the group milieu this morning because he did not like how he was greeted, so he left the group. Patient denies depression or depressive symptoms. He says he wants to be discharged to his apartment.  Reports a good appetite and states he is resting well. Staff reports that patient is visible on the unit. No disruptive behavior noted. There are some changes made on his current treatment regimen, see MAR. Support, encouragement and reassurance was provided.   Principal Problem: Schizophrenia, paranoid type (HCC)  Diagnosis:   Patient Active Problem List   Diagnosis Date Noted  . Schizophrenia, paranoid type (HCC) [F20.0] 10/20/2016   Total Time spent with patient: 15 minutes  Past Psychiatric History:   Past Medical History:  Past Medical History:  Diagnosis Date  . Genital warts   . Schizophrenia (HCC)    History reviewed. No pertinent surgical history. Family History: History reviewed. No pertinent family history.  Family Psychiatric  History: See H&P  Social History:  History  Alcohol Use  . Yes    Comment: occ     History   Drug Use No    Social History   Social History  . Marital status: Single    Spouse name: N/A  . Number of children: N/A  . Years of education: N/A   Social History Main Topics  . Smoking status: Current Some Day Smoker    Types: Cigarettes  . Smokeless tobacco: Never Used  . Alcohol use Yes     Comment: occ  . Drug use: No  . Sexual activity: Not Asked   Other Topics Concern  . None   Social History Narrative  . None   Additional Social History:   Sleep: Fair  Appetite:  Fair  Current Medications: Current Facility-Administered Medications  Medication Dose Route Frequency Provider Last Rate Last Dose  . acetaminophen (TYLENOL) tablet 650 mg  650 mg Oral Q4H PRN Charm Rings, NP      . alum & mag hydroxide-simeth (MAALOX/MYLANTA) 200-200-20 MG/5ML suspension 30 mL  30 mL Oral Q6H PRN Charm Rings, NP      . divalproex (DEPAKOTE) DR tablet 500 mg  500 mg Oral BID Charm Rings, NP   500 mg at 10/29/16 0750  . magnesium hydroxide (MILK OF MAGNESIA) suspension 30 mL  30 mL Oral Daily PRN Charm Rings, NP      . nicotine (NICODERM CQ - dosed in mg/24 hours) patch 21 mg  21 mg Transdermal Daily Charm Rings, NP      . ondansetron [facility]) tablet 4 mg  4 mg Oral Q8H PRN Charm Rings, NP      . risperiDONE (RISPERDAL) tablet 1 mg  1 mg Oral BID Charm Rings, NP   1 mg at 10/29/16 0750  . traZODone (DESYREL) tablet 100 mg  100 mg Oral QHS Charm Rings, NP   100 mg at 10/28/16 2057   Lab Results:  No results found for this or any previous visit (from the past 48 hour(s)). Blood Alcohol level:  Lab Results  Component Value Date   ETH <5 10/24/2016   ETH <5 10/19/2016   Metabolic Disorder Labs: No results found for: HGBA1C, MPG No results found for: PROLACTIN Lab Results  Component Value Date   CHOL 137 10/27/2016   TRIG 62 10/27/2016   HDL 50 10/27/2016   CHOLHDL 2.7 10/27/2016   VLDL 12 10/27/2016   LDLCALC 75 10/27/2016   Physical  Findings: AIMS: Facial and Oral Movements Muscles of Facial Expression: None, normal Lips and Perioral Area: None, normal Jaw: None, normal Tongue: None, normal,Extremity Movements Upper (arms, wrists, hands, fingers): None, normal Lower (legs, knees, ankles, toes): None, normal, Trunk Movements Neck, shoulders, hips: None, normal, Overall Severity Severity of abnormal movements (highest score from questions above): None, normal Incapacitation due to abnormal movements: None, normal Patient's awareness of abnormal movements (rate only patient's report): No Awareness, Dental Status Current problems with teeth and/or dentures?: No Does patient usually wear dentures?: No  CIWA:  CIWA-Ar Total: 1 COWS:  COWS Total Score: 1  Musculoskeletal: Strength & Muscle Tone: within normal limits Gait & Station: normal Patient leans: N/A  Psychiatric Specialty Exam: Physical Exam  Vitals reviewed. Constitutional: He is oriented to person, place, and time. He appears well-developed.  Cardiovascular: Normal rate.   Neurological: He is alert and oriented to person, place, and time.  Psychiatric: He has a normal mood and affect. His behavior is normal.    ROS: Nurses notes & Vital signs reviewed.  Blood pressure 116/76, pulse 71, temperature 98.1 F (36.7 C), resp. rate 16, height 6\' 2"  (1.88 m), weight 88.9 kg (196 lb), SpO2 100 %.Body mass index is 25.16 kg/m.  General Appearance: Casual  Eye Contact:  Good, stares  Speech:  Clear and Coherent  Volume:  Decreased  Mood:  "I'm okay, doing well"  Affect:  Congruent  Thought Process:  Coherent and Descriptions of Associations: Intact  Orientation:  Full (Time, Place, and Person)  Thought Content:  Hallucinations: Auditory and Paranoid Ideation  Suicidal Thoughts:  Denies any thoughts, plans or intent  Homicidal Thoughts:  Denies any thoughts, plans or intent.  Memory:  Immediate;   Fair Recent;   Fair Remote;   Fair  Judgement:  Fair   Insight:  Fair  Psychomotor Activity:  Decreased  Concentration:  Concentration: Fair  Recall:  [employer] of Knowledge:  Fair  Language:  Fair  Akathisia:  No  Handed:  Right  AIMS (if indicated):     Assets:  Communication Skills Desire for Improvement Resilience Social Support  ADL's:  Intact  Cognition:  WNL  Sleep:  Number of Hours: 4.75   Treatment Plan Summary: Daily contact with patient to assess and evaluate symptoms and progress in treatment and Medication management   Will continue today 10/29/16 plan as below except where it is noted.  Mood control: Increased Risperdal from 1 mg to 2 mg twice daily.  Risperdal 1 mg once now for mood control.  Mood stabilization: Continue Depakote 500 mg bid for  mood stabilization.  Insomnia: Continue Trazodone 100 mg for insomnia.  Nicotine withdrawal: Continue Nicotine patch 21 mg Q 24 hours.  Will continue to monitor vitals ,medication compliance and treatment side effects while patient is here.   Reviewed labs,BAL - , UDS -. TSH 3.44, Valproic acid level 71 on 10/27/16  CSW will start working on disposition, will contact family for collateral information. .  Patient encouraged to continue to participate in therapeutic milieu  Sanjuana KavaNwoko, Lane Eland I, NP, PMHNP, FNP-BC. 10/29/2016, 11:01 AMPatient ID: Paul DownerBrian Mendez, male   DOB: Nov 04, 1985, 30 y.o.   MRN: [id number]

## 2016-10-29 NOTE — Plan of Care (Signed)
Problem: Nutritional: Goal: Ability to achieve adequate nutritional intake will improve Outcome: Progressing Pt reports good appetite on self inventory sheet. Tolerates all PO intake well.   Problem: Safety: Goal: Ability to remain free from injury will improve Outcome: Progressing Routine safety checks remains effective without self harm gestures.

## 2016-10-29 NOTE — [facility] Group Notes (Signed)
[facility] LCSW Group Therapy 10/29/2016 11:00 AM  Type of Therapy: Group Therapy- Feelings about Diagnosis  Participation Level: Active   Participation Quality:  Appropriate  Affect:  Blunted  Cognitive: Alert and Oriented   Insight:  Developing   Engagement in Therapy: Developing/Improving and Engaged   Modes of Intervention: Clarification, Confrontation, Discussion, Education, Exploration, Limit-setting, Orientation, Problem-solving, Rapport Building, Dance movement psychotherapisteality Testing, Socialization and Support  Description of Group:   This group will allow patients to explore their thoughts and feelings about diagnoses they have received. Patients will be guided to explore their level of understanding and acceptance of these diagnoses. Facilitator will encourage patients to process their thoughts and feelings about the reactions of others to their diagnosis, and will guide patients in identifying ways to discuss their diagnosis with significant others in their lives. This group will be process-oriented, with patients participating in exploration of their own experiences as well as giving and receiving support and challenge from other group members.  Summary of Progress/Problems:  Pt reported that he has a hard time opening up to others. Patient also reports finding it very hard when he does not have structure or order in his life. Patient reports when he finds himself feeling low, he surrounds himself by positive people in his life.   Therapeutic Modalities:   Cognitive Behavioral Therapy Solution Focused Therapy Motivational Interviewing Relapse Prevention Therapy  Fernande BoydenJoyce Nyeemah Jennette, LCSWA Clinical Social Worker [facility]

## 2016-10-29 NOTE — Progress Notes (Signed)
D: Pt at the time of assessment was flat, isolative and withdrawn to room-remained in bed with eyes closed. Pt only responded by nodding his head for "yes" and shaking from side to side for "no"-elective mutism. Pt denied depression, anxiety, pain, SI, HI or AVH. Pt however look to be a little anxious. A: Medications offered as prescribed. Support, encouragement, and safe environment provided. 15-minute safety checks continue. R: Pt was med compliant. Pt did not attend wrap-up group. Safety checks continue.

## 2016-10-29 NOTE — Progress Notes (Signed)
Recreation Therapy Notes  Date: 10/29/2016 Time: 10:00am Location: [address] Dayroom  Group Topic: Exercise  Goal Area(s) Addresses:  Pt will be able to identify the benefits of exercising. Pt will be able to experience the benefits of exercising during the group.  Behavioral Response: Minimal  Intervention: Exercise  Activity: Pt will throw two dice and depending on the number they roll they will do the exercise that corresponds with that number.  Education: Exercise, Discharge Planning  Education Outcome: Needs additional education  Clinical Observations/Feedback: Pt joined group as processing discussion ended.  Marvell Fullerachel Meyer, Recreation Therapy Intern  Caroll RancherMarjette Dayshon Roback, LRT/CTRS

## 2016-10-29 NOTE — Progress Notes (Signed)
D: Pt A & O to self, place and time. Presents guarded with blunted affect on approach but brightens up when engaged.  Visible in milieu majority of this shift. Observed interacting well with staff and peers. Attended scheduled unit groups. Denies concerns at this time. Rates his depression, hopelessness and anxiety 0/10. Reports he's sleeping well with good appetite and good concentration level.  A: Support and encouragement provided to pt. Medications administered as prescribed and effects monitored. Routine safety checks maintained without self harm gestures or outburst to note. R: Pt receptive to care. Compliant with medications when offered. Denies adverse drug reactions. Remains safe on and off unit.

## 2016-10-30 NOTE — Progress Notes (Signed)
Patient ID: Paul DownerBrian Mendez, male   DOB: 1986/01/09, 31 y.o.   MRN: [id number]  Pt currently presents with a flat affect that brightens with interaction and guarded behavior. Pt remains in the dayroom tonight with minimal interaction with peers. Pt forwards little to Clinical research associatewriter. Pt reports good sleep with current medication regimen.   Pt provided with medications per providers orders. Pt's labs and vitals were monitored throughout the night. Pt given a 1:1 about emotional and mental status. Pt supported and encouraged to express concerns and questions. Pt educated on medications.  Pt's safety ensured with 15 minute and environmental checks. Pt currently denies SI/HI and A/V hallucinations. Pt verbally agrees to seek staff if SI/HI or A/VH occurs and to consult with staff before acting on any harmful thoughts. Will continue POC.

## 2016-10-30 NOTE — Progress Notes (Signed)
Patient ID: Paul DownerBrian Mendez, male   DOB: 10/15/1985, 31 y.o.   MRN: [id number] D) Pt initially guarded, seclusive, paranoid on morning med pass. Pt positive for a.m. Groups with minimal interaction although attentive. Throughout this shift pt has been asking questions and initiating conversations. Pt denies avh although appears paranoid and susipious at time. A) Level 3 obs for safety, support and encouragement provided. Med ed reinforced. R) Cooperative.

## 2016-10-30 NOTE — Progress Notes (Signed)
[facility] MD Progress Note  10/30/2016 2:44 PM Paul Mendez  MRN:  [id number]   Subjective: Paul Mendez reports "I'm doing fine. I feel a lot better today. Am I getting discharged today?  Objective:  Paul Mendez is seen, chart reviewed. Case discussed with the treatment team. He is resting in in his room, lying down in his bed. He reports doing well. Reports he has been taking is medications as prescribed. Denies any adverse effects or reactions . Denies suicidal or homicidal ideation. Admits hearing voices to the nurses auditory only that the voices has reduced since being here. He continues to present with some form of thought blocking whereby he is slow to respond to questions. His affect is flat. He does not really interact much with staff or other patients, except when approached.  Patient denies depression or depressive symptoms. He says he wants to be discharged to his apartment.  Reports a good appetite and states he is resting well. Staff reports that patient is visible on the unit. No disruptive behavior noted. There are no changes made on his current treatment regimen today, see MAR. Support, encouragement and reassurance was provided.   Principal Problem: Schizophrenia, paranoid type (HCC)  Diagnosis:   Patient Active Problem List   Diagnosis Date Noted  . Schizophrenia, paranoid type (HCC) [F20.0] 10/20/2016   Total Time spent with patient: 15 minutes  Past Psychiatric History:   Past Medical History:  Past Medical History:  Diagnosis Date  . Genital warts   . Schizophrenia (HCC)    History reviewed. No pertinent surgical history. Family History: History reviewed. No pertinent family history.  Family Psychiatric  History: See H&P  Social History:  History  Alcohol Use  . Yes    Comment: occ     History  Drug Use No    Social History   Social History  . Marital status: Single    Spouse name: N/A  . Number of children: N/A  . Years of education: N/A   Social History Main Topics   . Smoking status: Current Some Day Smoker    Types: Cigarettes  . Smokeless tobacco: Never Used  . Alcohol use Yes     Comment: occ  . Drug use: No  . Sexual activity: Not Asked   Other Topics Concern  . None   Social History Narrative  . None   Additional Social History:   Sleep: Fair  Appetite:  Fair  Current Medications: Current Facility-Administered Medications  Medication Dose Route Frequency Provider Last Rate Last Dose  . acetaminophen (TYLENOL) tablet 650 mg  650 mg Oral Q4H PRN Charm Rings, NP      . alum & mag hydroxide-simeth (MAALOX/MYLANTA) 200-200-20 MG/5ML suspension 30 mL  30 mL Oral Q6H PRN Charm Rings, NP      . divalproex (DEPAKOTE) DR tablet 500 mg  500 mg Oral BID Charm Rings, NP   500 mg at 10/30/16 0830  . magnesium hydroxide (MILK OF MAGNESIA) suspension 30 mL  30 mL Oral Daily PRN Charm Rings, NP      . nicotine (NICODERM CQ - dosed in mg/24 hours) patch 21 mg  21 mg Transdermal Daily Charm Rings, NP      . ondansetron [facility]) tablet 4 mg  4 mg Oral Q8H PRN Charm Rings, NP      . risperiDONE (RISPERDAL) tablet 2 mg  2 mg Oral BID Armandina Stammer I, NP   2 mg at 10/30/16 0830  .  traZODone (DESYREL) tablet 100 mg  100 mg Oral QHS Charm RingsLord, Jamison Y, NP   100 mg at 10/29/16 2101   Lab Results:  No results found for this or any previous visit (from the past 48 hour(s)). Blood Alcohol level:  Lab Results  Component Value Date   ETH <5 10/24/2016   ETH <5 10/19/2016   Metabolic Disorder Labs: No results found for: HGBA1C, MPG No results found for: PROLACTIN Lab Results  Component Value Date   CHOL 137 10/27/2016   TRIG 62 10/27/2016   HDL 50 10/27/2016   CHOLHDL 2.7 10/27/2016   VLDL 12 10/27/2016   LDLCALC 75 10/27/2016   Physical Findings: AIMS: Facial and Oral Movements Muscles of Facial Expression: None, normal Lips and Perioral Area: None, normal Jaw: None, normal Tongue: None, normal,Extremity Movements Upper  (arms, wrists, hands, fingers): None, normal Lower (legs, knees, ankles, toes): None, normal, Trunk Movements Neck, shoulders, hips: None, normal, Overall Severity Severity of abnormal movements (highest score from questions above): None, normal Incapacitation due to abnormal movements: None, normal Patient's awareness of abnormal movements (rate only patient's report): No Awareness, Dental Status Current problems with teeth and/or dentures?: No Does patient usually wear dentures?: No  CIWA:  CIWA-Ar Total: 1 COWS:  COWS Total Score: 1  Musculoskeletal: Strength & Muscle Tone: within normal limits Gait & Station: normal Patient leans: N/A  Psychiatric Specialty Exam: Physical Exam  Vitals reviewed. Constitutional: He is oriented to person, place, and time. He appears well-developed.  Cardiovascular: Normal rate.   Neurological: He is alert and oriented to person, place, and time.  Psychiatric: He has a normal mood and affect. His behavior is normal.    ROS: Nurses notes & Vital signs reviewed.  Blood pressure 117/68, pulse 80, temperature 97.7 F (36.5 C), temperature source Oral, resp. rate 18, height 6\' 2"  (1.88 m), weight 88.9 kg (196 lb), SpO2 100 %.Body mass index is 25.16 kg/m.  General Appearance: Casual  Eye Contact:  Good, stares  Speech:  Clear and Coherent  Volume:  Decreased  Mood:  "I'm okay, doing well"  Affect:  Flat  Thought Process:  Coherent and Descriptions of Associations: Intact  Orientation:  Full (Time, Place, and Person)  Thought Content:  Hallucinations: Auditory and Paranoid Ideation  Suicidal Thoughts:  Denies any thoughts, plans or intent  Homicidal Thoughts:  Denies any thoughts, plans or intent.  Memory:  Immediate;   Fair Recent;   Fair Remote;   Fair  Judgement:  Fair  Insight:  Fair  Psychomotor Activity:  Decreased  Concentration:  Concentration: Fair  Recall:  [employer]  Fund of Knowledge:  Fair  Language:  Fair  Akathisia:  No  Handed:   Right  AIMS (if indicated):     Assets:  Communication Skills Desire for Improvement Resilience Social Support  ADL's:  Intact  Cognition:  WNL  Sleep:  Number of Hours: 4   Treatment Plan Summary: Daily contact with patient to assess and evaluate symptoms and progress in treatment and Medication management   Will continue today 10/30/16 plan as below except where it is noted.  Mood control: Increased Risperdal from 1 mg to 2 mg twice daily.  Risperdal 1 mg once now for mood control.  Mood stabilization: Continue Depakote 500 mg bid for mood stabilization.  Insomnia: Continue Trazodone 100 mg for insomnia.  Nicotine withdrawal: Continue Nicotine patch 21 mg Q 24 hours.  Will continue to monitor vitals ,medication compliance and treatment side effects while patient  is here.   Reviewed labs,BAL - , UDS -. TSH 3.44, Valproic acid level 71 on 10/27/16  CSW will start working on disposition, will contact family for collateral information. .  Patient encouraged to continue to participate in therapeutic milieu  Sanjuana KavaNwoko, Keagan Brislin I, NP, PMHNP, FNP-BC. 10/30/2016, 2:44 PMPatient ID: Paul Mendez, male   DOB: February 16, 1986, 30 y.o.   MRN: [id number] Patient ID: Paul Mendez, male   DOB: February 16, 1986, 31 y.o.   MRN: [id number]

## 2016-10-30 NOTE — [facility] Group Notes (Signed)
[facility] LCSW Group Therapy  10/30/2016 2:23 PM  Type of Therapy:  Group Therapy  Participation Level:  Active  Participation Quality:  Appropriate and Sharing  Affect:  Appropriate  Cognitive:  Appropriate  Insight:  Developing/Improving  Engagement in Therapy:  Engaged  Modes of Intervention:  Activity, Discussion, Socialization and Support  Summary of Progress/Problems: CSW started off group with an ice breaker in order to get each participant active and alert. Each participant introduced self and thought about one word that describes them with the first letter of their name. Arlys JohnBrian mentioned that he was boastful. Group participants were then asked to give their own definition of what emotion regulation means to them. Talk about what emotion they find most difficult to manage and the physical symptoms they encounter. Group members were then asked to think about positive coping skills to regulate their emotions. Arlys JohnBrian did a good job in participating. Patient mentioned that his anger is most difficult for him to handle and it is best for him to take time to himself to cool down. CSW provided feedback and patient was receptive. No concerns to report at this time.   Georgiann MohsJoyce S Hibba Schram 10/30/2016, 2:23 PM

## 2016-10-30 NOTE — Progress Notes (Signed)
Recreation Therapy Notes  Date: 10/30/2016  Time: 10:00am Location: [address] Dayroom  Group Topic: Self-Esteem  Goal Area(s) Addresses:  Pt will be able to successfully create a piece of artwork that shows others who they are. Pt will be able to successfully share their license plate with peers.  Behavioral Response: Engaged  Intervention: Art  Activity: Pt will be given a blank license plate template worksheet. Pt will be asked to create a license plate that represents who they are. Pt will then share their license plate with peers.  Education: Self-Esteem, Discharge Planning  Education Outcome: Acknowledges understanding  Clinical Observations/Feedback: Pt actively participated in activity. Pt identified that he wants to be an Olympian and he drew a red star to represent blood, sweat and tears because he continues to try and reach his goals. Pt successfully identified that by keeping our unique traits in mind it makes us feel special and it helps us realize that life requires constant work.  Marvell Fullerachel Meyer, Recreation Therapy Intern   Caroll RancherMarjette Shain Pauwels, LRT/CTRS      Marvell FullerRachel Meyer 10/30/2016 11:03 AM

## 2016-10-30 NOTE — [facility] Group Notes (Signed)
[facility] Group Notes:  (Nursing/MHT/Case Management/Adjunct)  Date:  10/30/2016  Time:  0900 Type of Therapy:  Nurse Education  Participation Level:  None  Participation Quality:  Attentive  Affect:  Anxious  Cognitive:  Alert  Insight:  None  Engagement in Group:  None  Modes of Intervention:  Discussion, Education and Rapport Building  Summary of Progress/Problems: Pt attentive but quiet during healthy supports group.   Harvel QualeMardis, Krishna Heuer 10/30/2016, 4:48 PM

## 2016-10-30 NOTE — Progress Notes (Signed)
D: Pt at the time of assessment was flat and withdrawn to self even while in the dayroom. Pt at the time complained of AH; states, "the voices have reduces since I got here."  Pt denied depression, anxiety, pain, SI or HI; "I feel much better." Pt remain calm and cooperative. A: Medications offered as prescribed. All patient's questions and concerns addressed. Support, encouragement, and safe environment provided. 15-minute safety checks continue. R: Pt was med compliant. Safety checks continue.

## 2016-10-31 MED ORDER — RISPERIDONE 2 MG PO TABS: 2.0000 mg | ORAL_TABLET | Freq: Two times a day (BID) | ORAL | 0 refills | Status: AC

## 2016-10-31 MED ORDER — NICOTINE 21 MG/24HR TD PT24: 21.0000 mg | MEDICATED_PATCH | Freq: Every day | TRANSDERMAL | 0 refills | Status: AC

## 2016-10-31 MED ORDER — DIVALPROEX SODIUM 500 MG PO DR TAB: 500.0000 mg | DELAYED_RELEASE_TABLET | Freq: Two times a day (BID) | ORAL | 0 refills | Status: AC

## 2016-10-31 MED ORDER — TRAZODONE HCL 100 MG PO TABS: 100.0000 mg | ORAL_TABLET | Freq: Every day | ORAL | 0 refills | Status: AC

## 2016-10-31 NOTE — Plan of Care (Signed)
Problem: [facility] Participation in Recreation Therapeutic Interventions Goal: STG-Patient will attend/participate in Rec Therapy Group Ses STG-The Patient will attend and participate in Recreation Therapy Group Sessions  Outcome: Completed/Met Date Met: 10/31/16 Pt attended and actively participated in recreation therapy group sessions.  Donovan Kail, Recreation Therapy Intern

## 2016-10-31 NOTE — Progress Notes (Signed)
Adult Psychoeducational Group Note  Date:  10/31/2016 Time:  1:25 AM  Group Topic/Focus:  Wrap-Up Group:   The focus of this group is to help patients review their daily goal of treatment and discuss progress on daily workbooks.  Participation Level:  Active  Participation Quality:  Appropriate  Affect:  Appropriate  Cognitive:  Appropriate  Insight: Appropriate  Engagement in Group:  Engaged  Modes of Intervention:  Discussion  Additional Comments:  Patient stated he attended all groups today. Patient rated his overall day a 8 out of 10. Patient stated his goal for today was to talk with his doctor about his discharge plan. Patient stated he felt happy when he achieved his goal.  Felipa FurnaceChristopher  Daire Okimoto 10/31/2016, 1:25 AM

## 2016-10-31 NOTE — Progress Notes (Signed)
Recreation Therapy Notes  Date: 10/31/2016 Time: 10:00am Location: [address] Dayroom  Group Topic: Leisure Education  Goal Area(s) Addresses:  Pt will be able to successfully identify leisure activities that they can do in their free time. Pt will be able to successfully identify community resources.  Behavioral Response: Engaged  Intervention: Game  Activity: Pts played a game of Leisure Apples to Apples. Each pt was given 5 red cards that had activities, people and places on them. Each pt took a turn taking a green card that had topics related to leisure such as "summer activities," " someone you would play basketball with." Pts would then give a red card to try and match the green card with the best answer. For example, if someone had a green card that said, "a person to play basketball with," someone may give a red card that say, "Glenetta BorgLebron James." Whoever finished the game with the most green cards won.  Education:Leisure Education, Discharge Planning  Education Outcome: Acknowledges understanding   Clinical Observations/Feedback: Pt actively participated in activity. Pt identified that he participates in leisure activities because they make him feel good and productive. Pt also identified that things get broken, sometimes the information about something is not sufficient and lack of money as obstacles he has faced when trying to participate in leisure activities.  Marvell Fullerachel Meyer, Recreation Therapy Intern  Caroll RancherMarjette Quintella Mura, LRT/CTRS

## 2016-10-31 NOTE — Progress Notes (Signed)
D: Patient observed in milieu. Patient exhibits thought blocking. Cautious in approach though cooperative. Patient's affect blunted, mood apprehensive. Per self inventory and discussions with writer, rates depression at a 0/10, hopelessness at a 0/10 and anxiety at a 3/10. Rates sleep as good, appetite as good, energy as normal and concentration as good.  States goal for today is to "make important phone calls, think over the conversation." Denies pain, physical problems.   A: Medicated per orders, no prns requested or required. Level III obs in place for safety. Emotional support offered and self inventory reviewed. Encouraged programming participation. Discussed POC with MD, SW.    R: Patient verbalizes understanding of POC. Patient denies SI/HI/AVH and remains safe on level III obs. Will continue to monitor closely and make verbal contact frequently. Identified for discharge today.

## 2016-10-31 NOTE — Progress Notes (Signed)
Recreation Therapy Notes  INPATIENT RECREATION TR PLAN  Patient Details Name: Paul Mendez MRN: [id number] DOB: 1985-10-22 Today's Date: 10/31/2016  Rec Therapy Plan Is patient appropriate for Therapeutic Recreation?: Yes Treatment times per week: at least 3x Estimated Length of Stay: 5-7 days TR Treatment/Interventions: Group participation (Comment) (appropriate participatin in recreation therapy groups)  Discharge Criteria Pt will be discharged from therapy if:: Discharged Treatment plan/goals/alternatives discussed and agreed upon by:: Patient/family  Discharge Summary Short term goals set: The Patient will attend and participate in Recreation Therapy Group Sessions  Short term goals met: Complete Progress toward goals comments: Groups attended Which groups?: Leisure education, Coping skills, Self-esteem Reason goals not met: n/a Therapeutic equipment acquired: n/a Reason patient discharged from therapy: Discharge from hospital Pt/family agrees with progress & goals achieved: Yes Date patient discharged from therapy: 10/31/16  Donovan Kail, Recreation Therapy Intern  Donovan Kail 10/31/2016, 11:24 AM    Victorino Sparrow, LRT/CTRS

## 2016-10-31 NOTE — [facility] Suicide Risk Assessment (Addendum)
[facility] Risk [facility]   Principal Problem: Schizophrenia, paranoid type [facility](HCC) Discharge Diagnoses:  Patient Active Problem List   Diagnosis Date Noted  . Tobacco dependency [F17.200] 04/21/2015    Priority: High  . Schizophrenia, paranoid type (HCC) [F20.0] 10/20/2016  . Erectile dysfunction [N52.9] 09/01/2013  . Inguinal lymphadenopathy [R59.0] 09/01/2013   Patient is a 31 year old male [facility] for stabilization and treatment of psychotic behaviors, patient had thought blocking, was unable to participate in assessment, was disorganized, had bizarre behaviors when he went to [facility].  Patient this morning reports that he is doing much better, states that he can get his thoughts together, is eating fine and sleeping well. He has that he knows he needs to have a schedule, has worked on this while inpatient. He also states he knows he needs to take his medications in order to do well. Patient denies any paranoia, hallucinations, any thoughts of hurting himself or others. He also denies any symptoms of depression, mania. He denies any side effects with his medications  Patient also states that he wants to quit smoking and plans to use the nicotine patch on discharge Total Time spent with patient: 30 minutes  Musculoskeletal: Strength & Muscle Tone: within normal limits Gait & Station: normal Patient leans: N/A  Psychiatric Specialty Exam: Review of Systems  Constitutional: Negative.  Negative for chills, fever and malaise/fatigue.  HENT: Negative.  Negative for congestion and sore throat.   Eyes: Negative.  Negative for blurred vision, double vision, discharge and redness.  Respiratory: Negative.  Negative for cough, shortness of breath and wheezing.   Cardiovascular: Negative.  Negative for chest pain and palpitations.  Gastrointestinal: Negative.  Negative for abdominal pain, constipation, diarrhea, heartburn, nausea  and vomiting.  Genitourinary: Negative.  Negative for dysuria.  Musculoskeletal: Negative.  Negative for falls, joint pain and myalgias.  Skin: Negative.   Neurological: Negative.  Negative for dizziness, seizures, loss of consciousness, weakness and headaches.  Endo/Heme/Allergies: Negative.  Negative for environmental allergies.  Psychiatric/Behavioral: Negative.  Negative for depression, hallucinations, memory loss, substance abuse and suicidal ideas. The patient is not nervous/anxious and does not have insomnia.     Blood pressure 116/75, pulse 80, temperature 98 F (36.7 C), temperature source Oral, resp. rate 18, height 6\' 2"  (1.88 m), weight 88.9 kg (196 lb), SpO2 100 %.Body mass index is 25.16 kg/m.  General Appearance: Casual  Eye Contact::  Fair  Speech:  Clear and Coherent and Normal Rate409  Volume:  Normal  Mood:  Euthymic  Affect:  Appropriate and Full Range  Thought Process:  Goal Directed and Descriptions of Associations: Intact  Orientation:  Full (Time, Place, and Person)  Thought Content:  WDL  Suicidal Thoughts:  No  Homicidal Thoughts:  No  Memory:  Immediate;   Fair Recent;   Fair Remote;   Fair  Judgement:  Intact  Insight:  Fair  Psychomotor Activity:  Normal  Concentration:  Fair  Recall:  FiservFair  Fund of Knowledge:Fair  Language: Fair  Akathisia:  No  Handed:  Right  AIMS (if indicated):     Assets:  Desire for Improvement Housing Physical Health  Sleep:  Number of Hours: 4.75  Cognition: WNL  ADL's:  Intact   Mental Status Per Nursing Assessment::   On Admission:  NA  Demographic Factors:  Male, Living alone and Unemployed  Loss Factors: Financial problems/change in socioeconomic status  Historical Factors: NA  Risk Reduction Factors:  NA  Continued Clinical Symptoms:  Schizophrenia:   Paranoid or undifferentiated type  Cognitive Features That Contribute To Risk:  None    Suicide Risk:  Minimal: No identifiable suicidal  ideation.  Patients presenting with no risk factors but with morbid ruminations; may be classified as minimal risk based on the severity of the depressive symptoms  Follow-up Information    [facility] Follow up on 11/04/2016.   Specialty:  Behavioral Health Why:  Medications management on July 30 at 10 AM.  Please all to cancel/reschedule if needed.  Contact information: [address] [place] [address] [phone]           Plan Of Care/Follow-up recommendations:  Activity:  As tolerated Diet:  Regular Other:  Keep follow-up appointments and take medications as prescribed  Nelly RoutKUMAR,Jasir Rother, MD 10/31/2016, 10:01 AM

## 2016-10-31 NOTE — Patient Instructions (Signed)
[facility] (Address: [address] [place], [place] [address] Phone: [phone]) is open Sheral FlowMon - Fri from 8:30 - 3:30.  On Mondays from 10 - Noon, they have a Insurance account managerVeterans counselor available who can assist you with accessing programs for housing support for veterans.

## 2016-10-31 NOTE — Progress Notes (Addendum)
  [facility] Adult [facility] Discharge Plan :  Will you be returning to the same living situation after discharge:  Yes,  Patient is returning to his apartment on today.  At discharge, do you have transportation home?: Yes,  Patient will be provided two bus passes [facility] and back home Do you have the ability to pay for your medications: Yes,  patient insured  Release of information consent forms completed and in the chart;  Patient's signature needed at discharge.  Patient to Follow up at: Follow-up Information    [facility] Follow up on 11/04/2016.   Specialty:  Behavioral Health Why:  Medications management on July 30 at 10 AM.  Please all to cancel/reschedule if needed. Patient referred to Transitional Care Team - services to begin on day of discharge.  Paul OarReggie Mendez [phone]) Contact information: [address] [place] [address] [phone]           Next level of care provider has access to [facility] Link:no  Safety Planning and Suicide Prevention discussed: Yes,  with mother  Have you used any form of tobacco in the last 30 days? (Cigarettes, Smokeless Tobacco, Cigars, and/or Pipes): Yes  Has patient been referred to the Quitline?: Patient refused referral  Patient has been referred for addiction treatment: Pt. refused referral  Paul Mendez 10/31/2016, 3:56 PM

## 2016-10-31 NOTE — Discharge Summary (Signed)
Physician Discharge Summary Note  Patient:  Paul Mendez is an 31 y.o., male MRN:  [id number] DOB:  [birth year]  Patient phone:  [phone] (home)   Patient address:   [address] [place] [address],   Total Time spent with patient: Greater than 30 minutes  Date of Admission:  10/25/2016 Date of Discharge: 10-31-16  Reason for Admission: Worsening psychosis.  Principal Problem: Schizophrenia, paranoid type [facility])  Discharge Diagnoses: Patient Active Problem List   Diagnosis Date Noted  . Schizophrenia, paranoid type (HCC) [F20.0] 10/20/2016   Past Psychiatric History: Paranoid Schizophrenia.  Past Medical History:  Past Medical History:  Diagnosis Date  . Genital warts   . Schizophrenia (HCC)    History reviewed. No pertinent surgical history.  Family History: History reviewed. No pertinent family history.  Family Psychiatric  History: See Md's SRA  Social History:  History  Alcohol Use  . Yes    Comment: occ     History  Drug Use No    Social History   Social History  . Marital status: Single    Spouse name: N/A  . Number of children: N/A  . Years of education: N/A   Social History Main Topics  . Smoking status: Current Some Day Smoker    Types: Cigarettes  . Smokeless tobacco: Never Used  . Alcohol use Yes     Comment: occ  . Drug use: No  . Sexual activity: Not Asked   Other Topics Concern  . None   Social History Narrative  . None   Hospital Course: (Per admission note): Paul Mendez an 30 y.o.male, who presents involuntarily and [facility]accompanied to [facility] Clinician asked the pt, "What brought you to the hospital?" Pt responded, "I just needed to be around some people." Pt denied SI, HI, self-injurious behaviors and access to weapons. Clinician asked if the pt had experienced any hallucinations or delusions? Pt replied,"I think I do, I see people get murdered. I just heard somebody get murdered." Pt reported, "I was trying to be  successful. I don't know if I'm having a vision." Pt reported, hearing the act of someone getting murdered.  After the above admission assessment, Mickael was started on medication regimen for his presenting symptoms. He was medicated & discharged on; Depakote 500 mg for mood stabilization, Nicotine patch 21 mg for smoking cessation, Risperdal 2 mg for mood control & Trazodone 100 mg for insomnia. He was also enrolled & participated in the group counseling sessions being offered & held on this unit. He learned coping skills that should help him after discharge to cope better.  As his treatment progressed, daily assessment notes marked improvement in [name]'s symptoms.There are no command hallucinations. He says he feels good.  He is optimistic about the future. No residual psychotic features. No anxiety. No thoughts of violence. No access to weapons. No new stressors.    [name]'s case was presented during treatment team meeting this morning. The nursing staff reports that patient has been appropriate on the unit. Patient has been interacting well with peers. No behavioral issues. Patient has not voiced any suicidal thoughts. Patient has not been observed to be internally stimulated or preoccupied. Patient has been adherent with treatment recommendations. Patient has been tolerating their medication well.   During care review & discussion of his progress this morning at the treatment team meeting. Team members feels that patient is back to his baseline level of function. Team agrees with plan to discharge patient today to continue  mental health care on an outpatient basis as noted below. He is provided with all the necessary information needed to make this appointment without any problems. [facility] in no apparent distress. [facility] assisted with transportation bus fare to get to his apartment. He was provided with a 7 days worth, supply samples of his [facility] medications.  Transportation per city bus.  Physical Findings: AIMS: Facial and Oral Movements Muscles of Facial Expression: None, normal Lips and Perioral Area: None, normal Jaw: None, normal Tongue: None, normal,Extremity Movements Upper (arms, wrists, hands, fingers): None, normal Lower (legs, knees, ankles, toes): None, normal, Trunk Movements Neck, shoulders, hips: None, normal, Overall Severity Severity of abnormal movements (highest score from questions above): None, normal Incapacitation due to abnormal movements: None, normal Patient's awareness of abnormal movements (rate only patient's report): No Awareness, Dental Status Current problems with teeth and/or dentures?: No Does patient usually wear dentures?: No  CIWA:  CIWA-Ar Total: 1 COWS:  COWS Total Score: 1  Musculoskeletal: Strength & Muscle Tone: within normal limits Gait & Station: normal Patient leans: N/A  Psychiatric Specialty Exam: Physical Exam  Constitutional: He appears well-developed.  HENT:  Head: Normocephalic.  Eyes: Pupils are equal, round, and reactive to light.  Neck: Normal range of motion.  Cardiovascular: Normal rate.   Respiratory: Effort normal.  GI: Soft.  Genitourinary:  Genitourinary Comments: Deferred  Musculoskeletal: Normal range of motion.  Neurological: He is alert.  Skin: Skin is warm.    Review of Systems  Constitutional: Negative.   HENT: Negative.   Eyes: Negative.   Respiratory: Negative.   Cardiovascular: Negative.   Gastrointestinal: Negative.   Genitourinary: Negative.   Musculoskeletal: Negative.   Skin: Negative.   Neurological: Negative.   Endo/Heme/Allergies: Negative.   Psychiatric/Behavioral: Positive for depression (Stable) and hallucinations (Hx. hallucinations, paranoia). Negative for memory loss, substance abuse and suicidal ideas. The patient has insomnia (Stable). The patient is not nervous/anxious.     Blood pressure 116/75, pulse 80, temperature 98 F (36.7 C),  temperature source Oral, resp. rate 18, height 6\' 2"  (1.88 m), weight 88.9 kg (196 lb), SpO2 100 %.Body mass index is 25.16 kg/m.  See Md's SRA   Have you used any form of tobacco in the last 30 days? (Cigarettes, Smokeless Tobacco, Cigars, and/or Pipes): Yes  Has this patient used any form of tobacco in the last 30 days? (Cigarettes, Smokeless Tobacco, Cigars, and/or Pipes):Yes, provided with nicotine patch prescription upon discharge.  Blood Alcohol level:  Lab Results  Component Value Date   ETH <5 10/24/2016   ETH <5 10/19/2016   Metabolic Disorder Labs:  No results found for: HGBA1C, MPG No results found for: PROLACTIN Lab Results  Component Value Date   CHOL 137 10/27/2016   TRIG 62 10/27/2016   HDL 50 10/27/2016   CHOLHDL 2.7 10/27/2016   VLDL 12 10/27/2016   LDLCALC 75 10/27/2016   See Psychiatric Specialty Exam and Suicide Risk Assessment completed by Attending Physician prior to discharge.  Discharge destination:  Home  Is patient on multiple antipsychotic therapies at discharge:  No   Has Patient had three or more failed trials of antipsychotic monotherapy by history:  No  Recommended Plan for Multiple Antipsychotic Therapies: NA  Allergies as of 10/31/2016   No Known Allergies     Medication List    TAKE these medications     Indication  divalproex 500 MG DR tablet Commonly known as:  DEPAKOTE Take 1 tablet (  500 mg total) by mouth 2 (two) times daily. For mood stabilization What changed:  additional instructions  Indication:  Mood stabilization   nicotine 21 mg/24hr patch Commonly known as:  NICODERM CQ - dosed in mg/24 hours Place 1 patch (21 mg total) onto the skin daily. For smoking cessation  Indication:  Nicotine Addiction   risperiDONE 2 MG tablet Commonly known as:  RISPERDAL Take 1 tablet (2 mg total) by mouth 2 (two) times daily. For mood control What changed:  medication strength  how much to take  additional instructions   Indication:  Mood control   traZODone 100 MG tablet Commonly known as:  DESYREL Take 1 tablet (100 mg total) by mouth at bedtime. For sleep What changed:  additional instructions  Indication:  Trouble Sleeping      Follow-up Information    [facility] Follow up on 11/04/2016.   Specialty:  Behavioral Health Why:  Medications management on July 30 at 10 AM.  Please all to cancel/reschedule if needed.  Contact informationElpidio Eric: [address] [place] [address] [phone]          Follow-up recommendations: Activity:  As tolerated Diet: As recommended by your primary care doctor. Keep all scheduled follow-up appointments as recommended.   Comments: Patient is instructed prior to discharge to: Take all medications as prescribed by his/her mental healthcare provider. Report any adverse effects and or reactions from the medicines to his/her outpatient provider promptly. Patient has been instructed & cautioned: To not engage in alcohol and or illegal drug use while on prescription medicines. In the event of worsening symptoms, patient is instructed to call the crisis hotline, 911 and or go to the nearest ED for appropriate evaluation and treatment of symptoms. To follow-up with his/her primary care provider for your other medical issues, concerns and or health care needs.   Signed: Sanjuana KavaNwoko, Mussa Groesbeck I, NP, PMHNP, FNP-BC 10/31/2016, 9:47 AM

## 2016-10-31 NOTE — Progress Notes (Signed)
Patient verbalizes readiness for discharge. Follow up plan explained, AVS, transition record and SRA given along with prescriptions. Sample meds given. Work Physicist, medicalletter provided. All belongings returned. Patient verbalizes understanding. Denies SI/HI and assures this Clinical research associatewriter he will seek assistance should that change. Patient discharged ambulatory and in stable condition to with 2 bus passes and directions.

## 2016-10-31 NOTE — Social Work (Addendum)
Reggie from Transitional Care Team meet w patient today to provide help w outpatient resources.  CSW met w patient and CSW Smyre, patient states he has his own apartment and has a lease.  He can return to apartment at discharge, has not been evicted.  His plas was to go to DSS tomorrow and ask for emergency loan to pay rent until he reenrolls in school and receives [employer] as well as financial aid. Transitional Care Team will assist w patient access to resources outpatient.  CSW will also link patient with PATH program and [employer] for at risk of homelessness counseling and assistance. Patient may not be eligible for PATH due to current linkage w outpatient providers, however patient is eligible for veterans housing counseling (Monday from 10 - noon) at [employer].   Patient states that he will need bus pass to go to [employer] as well as home, these will be provided prior to discharge.  Edwyna Shell, LCSW Lead Clinical Social Worker Phone:  [phone]

## 2016-11-12 LAB — PROLACTIN: PROLACTIN: 36.2 ng/mL — AB (ref 4.0–15.2)
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
